# Patient Record
Sex: Female | Born: 1950 | Race: White | Hispanic: No | Marital: Single | Smoking: Current every day smoker
Health system: Southern US, Community
[De-identification: ages and names within clinical notes are randomized; demographics above are authoritative.]

## PROBLEM LIST (undated history)

## (undated) DIAGNOSIS — F1510 Other stimulant abuse, uncomplicated: Secondary | ICD-10-CM

## (undated) DIAGNOSIS — Q159 Congenital malformation of eye, unspecified: Secondary | ICD-10-CM

## (undated) DIAGNOSIS — J189 Pneumonia, unspecified organism: Secondary | ICD-10-CM

## (undated) DIAGNOSIS — F1010 Alcohol abuse, uncomplicated: Secondary | ICD-10-CM

## (undated) DIAGNOSIS — F32A Depression, unspecified: Secondary | ICD-10-CM

## (undated) DIAGNOSIS — Z720 Tobacco use: Secondary | ICD-10-CM

## (undated) DIAGNOSIS — F329 Major depressive disorder, single episode, unspecified: Secondary | ICD-10-CM

## (undated) HISTORY — PX: EYE SURGERY: SHX253

## (undated) HISTORY — PX: BREAST ENHANCEMENT SURGERY: SHX7

---

## 2004-01-13 ENCOUNTER — Ambulatory Visit (HOSPITAL_COMMUNITY): Admission: RE | Admit: 2004-01-13 | Discharge: 2004-01-13 | Payer: Self-pay | Admitting: *Deleted

## 2004-08-15 ENCOUNTER — Encounter: Admission: RE | Admit: 2004-08-15 | Discharge: 2004-08-15 | Payer: Self-pay | Admitting: General Practice

## 2004-09-01 ENCOUNTER — Encounter (INDEPENDENT_AMBULATORY_CARE_PROVIDER_SITE_OTHER): Payer: Self-pay | Admitting: *Deleted

## 2004-09-01 ENCOUNTER — Ambulatory Visit (HOSPITAL_COMMUNITY): Admission: RE | Admit: 2004-09-01 | Discharge: 2004-09-01 | Payer: Self-pay | Admitting: Pulmonary Disease

## 2004-09-13 ENCOUNTER — Ambulatory Visit: Payer: Self-pay | Admitting: Pulmonary Disease

## 2004-09-22 ENCOUNTER — Ambulatory Visit: Payer: Self-pay | Admitting: Pulmonary Disease

## 2004-10-16 ENCOUNTER — Ambulatory Visit: Payer: Self-pay | Admitting: Pulmonary Disease

## 2004-10-18 ENCOUNTER — Ambulatory Visit (HOSPITAL_COMMUNITY): Admission: RE | Admit: 2004-10-18 | Discharge: 2004-10-18 | Payer: Self-pay | Admitting: Pulmonary Disease

## 2004-12-01 ENCOUNTER — Ambulatory Visit (HOSPITAL_COMMUNITY): Admission: RE | Admit: 2004-12-01 | Discharge: 2004-12-01 | Payer: Self-pay | Admitting: Pulmonary Disease

## 2004-12-14 ENCOUNTER — Ambulatory Visit: Payer: Self-pay | Admitting: Pulmonary Disease

## 2005-07-30 ENCOUNTER — Ambulatory Visit: Payer: Self-pay | Admitting: Internal Medicine

## 2005-08-06 ENCOUNTER — Ambulatory Visit: Payer: Self-pay | Admitting: Internal Medicine

## 2006-11-18 ENCOUNTER — Ambulatory Visit: Payer: Self-pay | Admitting: Internal Medicine

## 2009-11-17 ENCOUNTER — Ambulatory Visit: Payer: Self-pay | Admitting: Vascular Surgery

## 2010-08-23 ENCOUNTER — Ambulatory Visit: Payer: Self-pay | Admitting: Diagnostic Radiology

## 2010-08-23 ENCOUNTER — Emergency Department (HOSPITAL_BASED_OUTPATIENT_CLINIC_OR_DEPARTMENT_OTHER): Admission: EM | Admit: 2010-08-23 | Discharge: 2010-08-23 | Payer: Self-pay | Admitting: Emergency Medicine

## 2010-11-05 HISTORY — PX: CATARACT EXTRACTION: SUR2

## 2010-11-05 HISTORY — PX: FOOT SURGERY: SHX648

## 2010-11-25 ENCOUNTER — Encounter: Payer: Self-pay | Admitting: Pulmonary Disease

## 2011-03-23 NOTE — Op Note (Signed)
NAME:  Connie Key, Connie Key                         ACCOUNT NO.:  [id number]   MEDICAL RECORD NO.:  [id number]                   PATIENT TYPE:  AMB   LOCATION:  ENDO                                 FACILITY:  [facility]   PHYSICIAN:  John C. Madilyn Fireman, M.D.                 DATE OF BIRTH:  Nov 28, 1950   DATE OF PROCEDURE:  01/13/2004  DATE OF DISCHARGE:                                 OPERATIVE REPORT   PROCEDURE PERFORMED:  Esophagogastroduodenoscopy.   ENDOSCOPIST:  Barrie Folk, M.D.   INDICATIONS FOR PROCEDURE:  Dysphagia and reflux.   DESCRIPTION OF PROCEDURE:  The patient was placed in the left lateral  decubitus position and placed on the pulse monitor with continuous low-flow  oxygen delivered by nasal cannula.  She was sedated with 50 mcg of IV  fentanyl and 5 mg of IV Versed.  The Olympus video endoscope was advanced  under direct vision into the oropharynx and esophagus was straight and of  normal caliber with the squamocolumnar line at 38 cm.  There was diffuse  erosive esophagitis in the distal 5 cm with areas of erythema and exudate  with two or three discrete erosions with possible minimal stricture  formation above a 2 cm hiatal hernia.  The stomach was entered and a small  amount of liquid secretions was suctioned from the fundus.  A retroflex view  of the cardia was unremarkable.  The fundus, body, antrum and pylorus all  appeared normal.  The duodenum was entered and both the bulb and second  portion were well inspected and appeared to be within normal limits.  The  scope was then withdrawn and the patient was returned to the recovery room  in stable condition.  The patient tolerated the procedure well.  There were  no immediate complications.   IMPRESSION:  Grade 4 esophagitis.   PLAN:  Treat with double dose proton pump inhibitor and discourage use of  Goody powders, which she takes frequently.  Follow-up in the office in six  weeks and will assess her dysphagia and  determine whether she needs  esophageal dilatation.                                               John C. Madilyn Fireman, M.D.    JCH/MEDQ  D:  01/13/2004  T:  01/14/2004  Job:  [id number]

## 2011-03-23 NOTE — Op Note (Signed)
NAMESHEQUILA, NEGLIA               ACCOUNT NO.:  [id number]   MEDICAL RECORD NO.:  [id number]          PATIENT TYPE:  AMB   LOCATION:  ENDO                         FACILITY:  [facility]   PHYSICIAN:  Danice Goltz, M.D. LHCDATE OF BIRTH:  [birth year]   DATE OF PROCEDURE:  09/01/2004  DATE OF DISCHARGE:                                 OPERATIVE REPORT   PROCEDURE:  Video bronchoscopy.   This is a 60 year old white female who is being evaluated for right hilar  mass.  She is currently a smoker.  The patient also has a left upper lobe  nodule.  The patient understood the limitations, benefits and potential  complications of the procedure and agreed to go ahead.  The patient was an  outpatient status.  She had a suitable exam for conscious sedation.  She is  an ASA level 2.   The patient presented to the endoscopy suite where she was placed on the  fluoroscopy table.  She had an IV in place.  She was being monitored as far  as respirations, heart rate, blood pressure, and oxygen saturations.  The  patient had the posterior pharynx anesthetized with Cetacaine.  She was  premedicated with a total of 5 mg Versed and 100 mcg of Fentanyl IV.  The  patient was poorly cooperative throughout the entire procedure and could not  be further sedated to avoid respiratory depression.   The patient had the bronchoscope advanced to the vocal cords.  These were  somewhat irritated.  The trachea was noted to be erythematous.  The carina  was sharp.  The bronchoscope was then advanced to the right where the right  upper lobe was noted.  No lesions were noted.  Her right middle lobe and  right lower lobe subsegments were opened.  She had, however, extensive  chronic bronchitic changes.  She also had some mucous plugs that had to be  suctioned from the right middle lobe.  The patient, again, was poorly  cooperative and the procedure had to be done somewhat hastily as she  continued to try to pull on the  bronchoscope and her IV.  The bronchoscope  was then pulled back to the left where the left upper lobe lingula and left  lower lobe subsegments were examined and these had no endobronchial lesions.  Again, numerous mucous plugs were removed.  The patient also had chronic  bronchitic changes noted.   IMPRESSION:  1.  No overt endobronchial lesion.  2.  Examination hampered due to poorly cooperative patient.  3.  Mucous retention.  4.  Chronic bronchitic changes.   PLAN:  Await washings which have been sent for cytology, await those  results, determine accordingly further tests to perform if necessary.  Follow up with [facility].  The patient was sent to the endoscopy  suite recovery room in good condition.  She tolerated the procedure well.     LG/MEDQ  D:  09/01/2004  T:  09/01/2004  Job:  [id number]

## 2011-11-06 DIAGNOSIS — Q159 Congenital malformation of eye, unspecified: Secondary | ICD-10-CM

## 2011-11-06 DIAGNOSIS — J189 Pneumonia, unspecified organism: Secondary | ICD-10-CM

## 2011-11-06 HISTORY — DX: Congenital malformation of eye, unspecified: Q15.9

## 2011-11-06 HISTORY — DX: Pneumonia, unspecified organism: J18.9

## 2012-01-10 ENCOUNTER — Ambulatory Visit
Admission: RE | Admit: 2012-01-10 | Discharge: 2012-01-10 | Disposition: A | Discharge status: Home / Self Care | Payer: 59 | Source: Ambulatory Visit | Attending: Cardiology | Admitting: Cardiology

## 2012-01-10 ENCOUNTER — Other Ambulatory Visit: Payer: Self-pay | Admitting: Cardiology

## 2012-01-10 DIAGNOSIS — R0602 Shortness of breath: Secondary | ICD-10-CM

## 2012-02-05 ENCOUNTER — Other Ambulatory Visit: Payer: Self-pay

## 2012-02-05 ENCOUNTER — Emergency Department (HOSPITAL_COMMUNITY): Payer: 59

## 2012-02-05 ENCOUNTER — Inpatient Hospital Stay (HOSPITAL_COMMUNITY)
Admission: EM | Admit: 2012-02-05 | Discharge: 2012-02-08 | DRG: 101 | Disposition: A | Discharge status: Home / Self Care | Payer: 59 | Attending: Internal Medicine | Admitting: Internal Medicine

## 2012-02-05 ENCOUNTER — Encounter (HOSPITAL_COMMUNITY): Payer: Self-pay | Admitting: Internal Medicine

## 2012-02-05 DIAGNOSIS — Z720 Tobacco use: Secondary | ICD-10-CM

## 2012-02-05 DIAGNOSIS — M810 Age-related osteoporosis without current pathological fracture: Secondary | ICD-10-CM

## 2012-02-05 DIAGNOSIS — E876 Hypokalemia: Secondary | ICD-10-CM | POA: Diagnosis present

## 2012-02-05 DIAGNOSIS — R9431 Abnormal electrocardiogram [ECG] [EKG]: Secondary | ICD-10-CM | POA: Diagnosis present

## 2012-02-05 DIAGNOSIS — R911 Solitary pulmonary nodule: Secondary | ICD-10-CM | POA: Diagnosis present

## 2012-02-05 DIAGNOSIS — R569 Unspecified convulsions: Secondary | ICD-10-CM

## 2012-02-05 DIAGNOSIS — E559 Vitamin D deficiency, unspecified: Secondary | ICD-10-CM | POA: Diagnosis present

## 2012-02-05 DIAGNOSIS — G40802 Other epilepsy, not intractable, without status epilepticus: Principal | ICD-10-CM | POA: Diagnosis present

## 2012-02-05 DIAGNOSIS — F1020 Alcohol dependence, uncomplicated: Secondary | ICD-10-CM | POA: Diagnosis present

## 2012-02-05 DIAGNOSIS — F1010 Alcohol abuse, uncomplicated: Secondary | ICD-10-CM

## 2012-02-05 DIAGNOSIS — F17200 Nicotine dependence, unspecified, uncomplicated: Secondary | ICD-10-CM | POA: Diagnosis present

## 2012-02-05 DIAGNOSIS — F1520 Other stimulant dependence, uncomplicated: Secondary | ICD-10-CM

## 2012-02-05 DIAGNOSIS — K5289 Other specified noninfective gastroenteritis and colitis: Secondary | ICD-10-CM | POA: Diagnosis present

## 2012-02-05 DIAGNOSIS — Z780 Asymptomatic menopausal state: Secondary | ICD-10-CM

## 2012-02-05 HISTORY — DX: Congenital malformation of eye, unspecified: Q15.9

## 2012-02-05 HISTORY — DX: Depression, unspecified: F32.A

## 2012-02-05 HISTORY — DX: Pneumonia, unspecified organism: J18.9

## 2012-02-05 HISTORY — DX: Tobacco use: Z72.0

## 2012-02-05 HISTORY — DX: Major depressive disorder, single episode, unspecified: F32.9

## 2012-02-05 HISTORY — DX: Alcohol abuse, uncomplicated: F10.10

## 2012-02-05 HISTORY — DX: Other stimulant abuse, uncomplicated: F15.10

## 2012-02-05 LAB — BASIC METABOLIC PANEL
CO2: 27 mEq/L (ref 19–32)
Calcium: 9.1 mg/dL (ref 8.4–10.5)
Creatinine, Ser: 0.49 mg/dL — ABNORMAL LOW (ref 0.50–1.10)
GFR calc Af Amer: >90 mL/min (ref >90)
Sodium: 140 mEq/L (ref 135–145)

## 2012-02-05 LAB — COMPREHENSIVE METABOLIC PANEL
ALT: 11 U/L (ref 0–35)
AST: 16 U/L (ref 0–37)
Albumin: 3.7 g/dL (ref 3.5–5.2)
Alkaline Phosphatase: 105 U/L (ref 39–117)
BUN: 18 mg/dL (ref 6–23)
CO2: 28 mEq/L (ref 19–32)
Calcium: 8.9 mg/dL (ref 8.4–10.5)
Chloride: 100 mEq/L (ref 96–112)
Creatinine, Ser: 0.53 mg/dL (ref 0.50–1.10)
GFR calc Af Amer: >90 mL/min (ref >90)
GFR calc non Af Amer: >90 mL/min (ref >90)
Glucose, Bld: 88 mg/dL (ref 70–99)
Potassium: 2.8 mEq/L — ABNORMAL LOW (ref 3.5–5.1)
Sodium: 143 mEq/L (ref 135–145)
Total Bilirubin: 0.2 mg/dL — ABNORMAL LOW (ref 0.3–1.2)
Total Protein: 6.3 g/dL (ref 6.0–8.3)

## 2012-02-05 LAB — DIFFERENTIAL
Basophils Absolute: 0 10*3/uL (ref 0.0–0.1)
Basophils Relative: 1 % (ref 0–1)
Eosinophils Absolute: 0 10*3/uL (ref 0.0–0.7)
Eosinophils Relative: 0 % (ref 0–5)
Lymphocytes Relative: 19 % (ref 12–46)
Lymphs Abs: 1 10*3/uL (ref 0.7–4.0)
Monocytes Absolute: 0.4 10*3/uL (ref 0.1–1.0)
Monocytes Relative: 8 % (ref 3–12)
Neutro Abs: 3.6 10*3/uL (ref 1.7–7.7)
Neutrophils Relative %: 72 % (ref 43–77)

## 2012-02-05 LAB — CBC
HCT: 41.1 % (ref 36.0–46.0)
Hemoglobin: 13.9 g/dL (ref 12.0–15.0)
MCH: 33.3 pg (ref 26.0–34.0)
MCHC: 33.8 g/dL (ref 30.0–36.0)
MCV: 98.6 fL (ref 78.0–100.0)
Platelets: 241 10*3/uL (ref 150–400)
RBC: 4.17 MIL/uL (ref 3.87–5.11)
RDW: 13.5 % (ref 11.5–15.5)
WBC: 5 10*3/uL (ref 4.0–10.5)

## 2012-02-05 LAB — MAGNESIUM: Magnesium: 1.8 mg/dL (ref 1.5–2.5)

## 2012-02-05 MED ORDER — ONDANSETRON HCL 4 MG/2ML IJ SOLN
4.0000 mg | Freq: Four times a day (QID) | INTRAMUSCULAR | Status: DC | PRN
  Administered 2012-02-07: 4 mg via INTRAVENOUS
  Filled 2012-02-05: qty 2

## 2012-02-05 MED ORDER — TERIPARATIDE (RECOMBINANT) 600 MCG/2.4ML ~~LOC~~ SOLN
2.4000 mL | Freq: Every day | SUBCUTANEOUS | Status: DC
  Filled 2012-02-05: qty 2.4

## 2012-02-05 MED ORDER — ONDANSETRON HCL 4 MG PO TABS: 4.0000 mg | ORAL_TABLET | Freq: Four times a day (QID) | ORAL | Status: DC | PRN

## 2012-02-05 MED ORDER — TERIPARATIDE (RECOMBINANT) 600 MCG/2.4ML ~~LOC~~ SOLN: 600.0000 ug | Freq: Every day | SUBCUTANEOUS | Status: DC

## 2012-02-05 MED ORDER — ONDANSETRON HCL 4 MG/2ML IJ SOLN
4.0000 mg | Freq: Once | INTRAMUSCULAR | Status: AC
  Administered 2012-02-05: 4 mg via INTRAVENOUS
  Filled 2012-02-05: qty 2

## 2012-02-05 MED ORDER — THIAMINE HCL 100 MG/ML IJ SOLN
100.0000 mg | Freq: Every day | INTRAMUSCULAR | Status: DC
  Filled 2012-02-05 (×4): qty 1

## 2012-02-05 MED ORDER — NICOTINE 21 MG/24HR TD PT24
21.0000 mg | MEDICATED_PATCH | Freq: Every day | TRANSDERMAL | Status: DC
  Administered 2012-02-05 – 2012-02-08 (×4): 21 mg via TRANSDERMAL
  Filled 2012-02-05 (×4): qty 1

## 2012-02-05 MED ORDER — FOLIC ACID 1 MG PO TABS
1.0000 mg | ORAL_TABLET | Freq: Every day | ORAL | Status: DC
  Administered 2012-02-05 – 2012-02-08 (×4): 1 mg via ORAL
  Filled 2012-02-05 (×4): qty 1

## 2012-02-05 MED ORDER — VITAMIN B-1 100 MG PO TABS
100.0000 mg | ORAL_TABLET | Freq: Every day | ORAL | Status: DC
  Administered 2012-02-05 – 2012-02-08 (×4): 100 mg via ORAL
  Filled 2012-02-05 (×4): qty 1

## 2012-02-05 MED ORDER — SODIUM CHLORIDE 0.9 % IV SOLN
Freq: Once | INTRAVENOUS | Status: AC
  Administered 2012-02-05: 14:00:00 via INTRAVENOUS

## 2012-02-05 MED ORDER — ADULT MULTIVITAMIN W/MINERALS CH
1.0000 | ORAL_TABLET | Freq: Every day | ORAL | Status: DC
  Administered 2012-02-05 – 2012-02-08 (×4): 1 via ORAL
  Filled 2012-02-05 (×4): qty 1

## 2012-02-05 MED ORDER — LORAZEPAM 2 MG/ML IJ SOLN
1.0000 mg | Freq: Four times a day (QID) | INTRAMUSCULAR | Status: DC | PRN
  Administered 2012-02-06 – 2012-02-07 (×2): 1 mg via INTRAVENOUS
  Filled 2012-02-05 (×2): qty 1

## 2012-02-05 MED ORDER — NAPROXEN 250 MG PO TABS
250.0000 mg | ORAL_TABLET | Freq: Two times a day (BID) | ORAL | Status: DC
  Administered 2012-02-06 – 2012-02-08 (×6): 250 mg via ORAL
  Filled 2012-02-05 (×9): qty 1

## 2012-02-05 MED ORDER — MAGNESIUM SULFATE 40 MG/ML IJ SOLN
2.0000 g | Freq: Once | INTRAMUSCULAR | Status: AC
  Administered 2012-02-05: 2 g via INTRAVENOUS
  Filled 2012-02-05: qty 50

## 2012-02-05 MED ORDER — ENOXAPARIN SODIUM 40 MG/0.4ML ~~LOC~~ SOLN
40.0000 mg | Freq: Every day | SUBCUTANEOUS | Status: DC
  Administered 2012-02-05 – 2012-02-07 (×3): 40 mg via SUBCUTANEOUS
  Filled 2012-02-05 (×4): qty 0.4

## 2012-02-05 MED ORDER — POTASSIUM CHLORIDE CRYS ER 20 MEQ PO TBCR
40.0000 meq | EXTENDED_RELEASE_TABLET | Freq: Once | ORAL | Status: AC
  Administered 2012-02-05: 40 meq via ORAL
  Filled 2012-02-05: qty 2

## 2012-02-05 MED ORDER — SODIUM CHLORIDE 0.9 % IV SOLN
INTRAVENOUS | Status: DC
  Administered 2012-02-05: 19:00:00 via INTRAVENOUS
  Administered 2012-02-06 – 2012-02-07 (×2): 125 mL/h via INTRAVENOUS
  Administered 2012-02-07: 01:00:00 via INTRAVENOUS

## 2012-02-05 MED ORDER — POTASSIUM CHLORIDE CRYS ER 20 MEQ PO TBCR
40.0000 meq | EXTENDED_RELEASE_TABLET | Freq: Once | ORAL | Status: AC
  Administered 2012-02-06: 40 meq via ORAL
  Filled 2012-02-05: qty 2

## 2012-02-05 MED ORDER — LORAZEPAM 1 MG PO TABS
1.0000 mg | ORAL_TABLET | Freq: Four times a day (QID) | ORAL | Status: DC | PRN
  Administered 2012-02-06: 1 mg via ORAL
  Filled 2012-02-05: qty 1

## 2012-02-05 MED ORDER — ESCITALOPRAM OXALATE 20 MG PO TABS
20.0000 mg | ORAL_TABLET | Freq: Every day | ORAL | Status: DC
  Administered 2012-02-06 – 2012-02-08 (×3): 20 mg via ORAL
  Filled 2012-02-05 (×3): qty 1

## 2012-02-05 NOTE — ED Notes (Signed)
Pt also takes fortao for for bone weakness.  Not found in medication list.  Pt concerned that she has been having bp issues and now this since taking this new drug.  Injection daily

## 2012-02-05 NOTE — ED Notes (Signed)
Called report to [phone]

## 2012-02-05 NOTE — ED Notes (Signed)
Per EMS pt has appt to see cardiologist next week for bloodshot eyes that she has been having for some time.  No other history is known.

## 2012-02-05 NOTE — ED Notes (Addendum)
Per patient she has not had hypertension before.  Her BP normally on low side.  100/60 is her norm.  Denies fevers and headaches.  Pt states she has a slight headache now but not terrible.  Pt answering all questions appropriatly and even joking about ID bracelet.  Pt now alert oriented X4
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
## 2012-02-05 NOTE — Progress Notes (Signed)
Patient complaining of headache at 7/10 scale. BP noted running in the 150's over 80's. Bedtime BP reads 170/79. No prn med noted. MD notified.

## 2012-02-05 NOTE — ED Notes (Signed)
[id number] Ready 

## 2012-02-06 ENCOUNTER — Inpatient Hospital Stay (HOSPITAL_COMMUNITY): Payer: 59

## 2012-02-06 DIAGNOSIS — R569 Unspecified convulsions: Secondary | ICD-10-CM

## 2012-02-06 LAB — RAPID URINE DRUG SCREEN, HOSP PERFORMED
Cocaine: NOT DETECTED
Opiates: NOT DETECTED

## 2012-02-06 LAB — BASIC METABOLIC PANEL
CO2: 27 mEq/L (ref 19–32)
Calcium: 8.9 mg/dL (ref 8.4–10.5)
Creatinine, Ser: 0.5 mg/dL (ref 0.50–1.10)
GFR calc non Af Amer: >90 mL/min (ref >90)
Sodium: 141 mEq/L (ref 135–145)

## 2012-02-06 LAB — CBC
MCH: 32.9 pg (ref 26.0–34.0)
MCV: 100.9 fL — ABNORMAL HIGH (ref 78.0–100.0)
Platelets: 251 10*3/uL (ref 150–400)
RBC: 4.22 MIL/uL (ref 3.87–5.11)
RDW: 13.2 % (ref 11.5–15.5)
WBC: 4.8 10*3/uL (ref 4.0–10.5)

## 2012-02-06 MED ORDER — TERIPARATIDE (RECOMBINANT) 600 MCG/2.4ML ~~LOC~~ SOLN
20.0000 ug | Freq: Every day | SUBCUTANEOUS | Status: DC
  Administered 2012-02-07: 20 ug via SUBCUTANEOUS

## 2012-02-06 MED ORDER — TERIPARATIDE (RECOMBINANT) 600 MCG/2.4ML ~~LOC~~ SOLN
20.0000 ug | Freq: Every day | SUBCUTANEOUS | Status: DC
  Administered 2012-02-06: 20 ug via SUBCUTANEOUS

## 2012-02-06 MED ORDER — POTASSIUM CHLORIDE CRYS ER 20 MEQ PO TBCR
40.0000 meq | EXTENDED_RELEASE_TABLET | Freq: Once | ORAL | Status: AC
  Administered 2012-02-06: 40 meq via ORAL
  Filled 2012-02-06: qty 2

## 2012-02-06 MED ORDER — DEXTROSE 50 % IV SOLN: 1.0000 | Freq: Once | INTRAVENOUS | Status: DC

## 2012-02-06 MED ORDER — GADOBENATE DIMEGLUMINE 529 MG/ML IV SOLN
11.0000 mL | Freq: Once | INTRAVENOUS | Status: AC
  Administered 2012-02-06: 11 mL via INTRAVENOUS

## 2012-02-06 NOTE — Progress Notes (Signed)
UR Completed.  Serria Sloma Jane [phone] 02/06/2012  

## 2012-02-06 NOTE — Progress Notes (Signed)
Medical Student Daily Progress Note  Subjective: Did well overnight, no acute events.    Objective: Vital signs in last 24 hours: Filed Vitals:   02/05/12 1814 02/05/12 2146 02/06/12 0157 02/06/12 0519  BP:  170/79 140/79 155/94  Pulse:  90  91  Temp:  97.6 F (36.4 C)  97.9 F (36.6 C)  TempSrc:    Oral  Resp:  20  18  Height: 5\' 6"  (1.676 m)     Weight: 58.06 kg (128 lb)     SpO2:  91%  92%    Intake/Output Summary (Last 24 hours) at 02/06/12 1034 Last data filed at 02/06/12 0911  Gross per 24 hour  Intake    120 ml  Output      0 ml  Net    120 ml   Physical Exam: General - alert and pleasant, no distress HEENT - subconjunctival hemorrhages (R > L) slightly better than yesterday CV - RRR, no murmurs, rubs, gallops Lung - CTAB, no wheezes or crackles Neuro - CNs and motor grossly intact  Lab Results: Basic Metabolic Panel:  Lab 02/06/12 8295 02/05/12 2031 02/05/12 1756  NA 141 140 --  K 3.4* 3.4* --  CL 101 99 --  CO2 27 27 --  GLUCOSE 63* 78 --  BUN 16 18 --  CREATININE 0.50 0.49* --  CALCIUM 8.9 9.1 --  MG -- -- 1.8  PHOS -- -- --   Liver Function Tests:  Lab 02/05/12 1355  AST 16  ALT 11  ALKPHOS 105  BILITOT 0.2*  PROT 6.3  ALBUMIN 3.7   No results found for this basename: LIPASE:2,AMYLASE:2 in the last 168 hours No results found for this basename: AMMONIA:2 in the last 168 hours CBC:  Lab 02/06/12 0610 02/05/12 1355  WBC 4.8 5.0  NEUTROABS -- 3.6  HGB 13.9 13.9  HCT 42.6 41.1  MCV 100.9* 98.6  PLT 251 241   Cardiac Enzymes: No results found for this basename: CKTOTAL:3,CKMB:3,CKMBINDEX:3,TROPONINI:3 in the last 168 hours BNP: No results found for this basename: PROBNP:3 in the last 168 hours D-Dimer: No results found for this basename: DDIMER:2 in the last 168 hours CBG:  Lab 02/06/12 1001  GLUCAP 88   Hemoglobin A1C: No results found for this basename: HGBA1C in the last 168 hours Fasting Lipid Panel: No results found for  this basename: CHOL,HDL,LDLCALC,TRIG,CHOLHDL,LDLDIRECT in the last 621 hours Thyroid Function Tests: No results found for this basename: TSH,T4TOTAL,FREET4,T3FREE,THYROIDAB in the last 168 hours Coagulation: No results found for this basename: LABPROT:4,INR:4 in the last 168 hours Anemia Panel: No results found for this basename: VITAMINB12,FOLATE,FERRITIN,TIBC,IRON,RETICCTPCT in the last 168 hours Urine Drug Screen: Drugs of Abuse     Component Value Date/Time   LABOPIA NONE DETECTED 02/06/2012 0930   COCAINSCRNUR NONE DETECTED 02/06/2012 0930   LABBENZ NONE DETECTED 02/06/2012 0930   AMPHETMU NONE DETECTED 02/06/2012 0930   THCU NONE DETECTED 02/06/2012 0930   LABBARB NONE DETECTED 02/06/2012 0930    Alcohol Level:  Lab 02/06/12 0913  ETH <11   Urinalysis: No results found for this basename: COLORURINE:2,APPERANCEUR:2,LABSPEC:2,PHURINE:2,GLUCOSEU:2,HGBUR:2,BILIRUBINUR:2,KETONESUR:2,PROTEINUR:2,UROBILINOGEN:2,NITRITE:2,LEUKOCYTESUR:2 in the last 168 hours  Studies/Results: Ct Head Wo Contrast  02/05/2012  *RADIOLOGY REPORT*  Clinical Data: Pain.  Witnessed seizure with postictal.  No prior history of seizures.  Sudden onset of dizziness.  CT HEAD WITHOUT CONTRAST  Technique:  Contiguous axial images were obtained from the base of the skull through the vertex without contrast.  Comparison: CT head without contrast 08/23/2010.  Findings: No acute cortical infarct, hemorrhage, mass lesion is present.  The ventricles are of normal size.  No significant extra- axial fluid collection is present.  Vascular calcifications are noted within the cavernous carotid arteries bilaterally.  The paranasal sinuses and mastoid air cells are clear.  The osseous skull is intact.  IMPRESSION:  1.  Normal CT appearance the brain. 2.  Atherosclerosis.  Original Report Authenticated By: Jamesetta Orleans. MATTERN, M.D.   Medications:  Scheduled Meds:   . sodium chloride   Intravenous Once  . dextrose  1 ampule Intravenous  Once  . enoxaparin  40 mg Subcutaneous q1800  . escitalopram  20 mg Oral Daily  . folic acid  1 mg Oral Daily  . magnesium sulfate 1 - 4 g bolus IVPB  2 g Intravenous Once  . mulitivitamin with minerals  1 tablet Oral Daily  . naproxen  250 mg Oral BID WC  . nicotine  21 mg Transdermal Daily  . ondansetron  4 mg Intravenous Once  . potassium chloride  40 mEq Oral Once  . potassium chloride  40 mEq Oral Once  . potassium chloride  40 mEq Oral Once  . Teriparatide (Recombinant)  20 mcg Subcutaneous Daily  . thiamine  100 mg Oral Daily   Or  . thiamine  100 mg Intravenous Daily  . DISCONTD: Teriparatide (Recombinant)  2.4 mL Subcutaneous Daily  . DISCONTD: Teriparatide (Recombinant)  600 mcg Subcutaneous Daily   Continuous Infusions:   . sodium chloride 125 mL/hr (02/06/12 0343)   PRN Meds:.LORazepam, LORazepam, ondansetron (ZOFRAN) IV, ondansetron Assessment/Plan: 1) Loss of consciousness: The pattern of loss of consciousness, mouth foaming, subsequent tiredness/confusion, possible evidence of tongue-biting, and reported seizure-like activity by EMS seems consistent with new seizure disorder. The relationship to the flashes experienced suggests the possibility of complex partial seizure versus migrainous seizure vs occipital TIA/stroke. Hypoglycemia (was 63 the AM of 02/06/12) is a common cause of seizures and is therefore on the differential diagnosis.  Prolonged QTc raises the possibility of Torsades de Pointes as a cause of her loss of consciousness.  Neurology recommends MRI followed by EEG, and requests withholding neuroleptics and that they be contacted if she has another episode.  Urine toxicology on admission was negative, blood etoh on day 2 was <11.  Spoke with Earley Brooke who confirmed "flashers" was not due to retinal detachment, and macular degeneration was found - Dilated eye exam done March 7th, 2013. - Follow up brain MRI  - Follow up EEG  - Will follow up EMS records -  Waiting to hear back from Neurology why - Follow up on 12-lead EKG (obtain when potassium repleted) - Chest xray to follow up recent pneumonia - Monitor regular capillary blood glucoses  2) Prolonged QTc: QTc 526 ms on admission.  Could be due to hypokalemia (was 2.8) or hypomagnesemia (1.8). - Continue telemetry to monitor (patient at risk for Torsades de Pointes activity) - Provide oral KCl - Once potassium repleted, obtain 12-lead EKG  3) Alcoholism: Admits to being alcohol-dependent.  Denies history of withdrawal symptoms.  Thiamine given.  Normal LFTs, normal Mg (2 g given IV also).  Refused counseling. - CIWA protocol  - Continue to promote acceptance of counseling - Obtain PT/INR  4) Smoking: 40 pack-year smoking history.  - Nicotine patch to prevent withdrawal symptoms.   5) Osteoporosis: Present prior to admission. Diagnosed by endocrinologist, Dr. Corwin Levins.  DEXA on 08/23/11: femoral neck T score -2.7, lumbar T score -2.0,  forearm T score -4.2. She has vitamin D deficiency (level 43.7 on 01/24/12) and has suffered multiple fractures, including pelvic and foot. Workup with PTH was normal (level 71.8 in November 2012). Recently started a new medication a few months ago, recombinant teriparatide Fortera . She could not take boniva and fosamax due to GERD. She has a diagnosis of collagenous colits, for which she does not currently take steroids. She denies ever having taken steroids, but did not seem certain.    LOS: 1 day   This is a Psychologist, occupational Note.  The care of the patient was discussed with Dr. Meredith Pel and the assessment and plan formulated with their assistance.  Please see their attached note for official documentation of the daily encounter.  Aleene Davidson 02/06/2012, 10:34 AM
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
## 2012-02-06 NOTE — Progress Notes (Signed)
Patient noted non productive coughing. Sounds wet when coughing. Lungs auscultated and wet crackles noted to bilateral base. Stated she had pneumonia in February and took some ABT for it but does not know if it cleared. MD made aware and will notify oncoming staff.

## 2012-02-06 NOTE — Progress Notes (Signed)
Resident Co-sign Daily Note: I have seen the patient and reviewed the daily progress note by MS -IV, Prince Rome and discussed the care of the patient with him.  See below for documentation of my findings, assessment, and plans.  Subjective: She states that she feels better, has not had any seizure.  She states that she was seeing colored swirls intermittently in the past month. She does endorses occasional headache which light does make it worse, but denies any personal history of migraine or family history of migraine.  She also reports that she was diagnosed with pneumonia in early March and was treated with 5 days course of abx and her visual problem actually resolved for about 1 week and then returned. Objective: Vital signs in last 24 hours: Filed Vitals:   02/05/12 1814 02/05/12 2146 02/06/12 0157 02/06/12 0519  BP:  170/79 140/79 155/94  Pulse:  90  91  Temp:  97.6 F (36.4 C)  97.9 F (36.6 C)  TempSrc:    Oral  Resp:  20  18  Height: 5\' 6"  (1.676 m)     Weight: 128 lb (58.06 kg)     SpO2:  91%  92%   Physical Exam: General: alert, well-developed, and cooperative to examination.  Eyes: conjunctival hemorrhages on Right side. Fundoscopic exam: limited, did not appreciate retinal hemorrhage. Lungs: normal respiratory effort, no accessory muscle use, normal breath sounds, no crackles, and no wheezes. Heart: normal rate, regular rhythm, no murmur, no gallop, and no rub.  Abdomen: soft, non-tender, normal bowel sounds, no distention, no guarding Msk: no joint swelling, no joint warmth, and no redness over joints.  Extremities: No cyanosis, clubbing, edema Neurologic: alert & oriented X3, cranial nerves II-XII intact, strength normal in all extremities, sensation intact to light touch Skin: turgor normal and no rashes.  Psych: Oriented X3, memory intact for recent and remote, normally interactive, good eye contact, not anxious appearing, and not depressed appearing.  Lab  Results: Reviewed and documented in Electronic Record Micro Results: Reviewed and documented in Electronic Record Studies/Results: Reviewed and documented in Electronic Record Medications: Scheduled Meds:   . sodium chloride   Intravenous Once  . dextrose  1 ampule Intravenous Once  . enoxaparin  40 mg Subcutaneous q1800  . escitalopram  20 mg Oral Daily  . folic acid  1 mg Oral Daily  . gadobenate dimeglumine  11 mL Intravenous Once  . magnesium sulfate 1 - 4 g bolus IVPB  2 g Intravenous Once  . mulitivitamin with minerals  1 tablet Oral Daily  . naproxen  250 mg Oral BID WC  . nicotine  21 mg Transdermal Daily  . ondansetron  4 mg Intravenous Once  . potassium chloride  40 mEq Oral Once  . potassium chloride  40 mEq Oral Once  . potassium chloride  40 mEq Oral Once  . Teriparatide (Recombinant)  20 mcg Subcutaneous Daily  . thiamine  100 mg Oral Daily   Or  . thiamine  100 mg Intravenous Daily  . DISCONTD: Teriparatide (Recombinant)  2.4 mL Subcutaneous Daily  . DISCONTD: Teriparatide (Recombinant)  600 mcg Subcutaneous Daily   Continuous Infusions:   . sodium chloride 125 mL/hr (02/06/12 0343)   PRN Meds:.LORazepam, LORazepam, ondansetron (ZOFRAN) IV, ondansetron  Assessment/Plan: 1) Seizure: stable, no other episodes since admission. Unclear etiology at this time. -  brain MRI pending -  EEG pending - Will follow up EMS records    2) Prolonged QTc: QTc 526 ms on  admission. Could be due to hypokalemia (was 2.8) or hypomagnesemia (1.8) on admission but K is now 3.4.  - Continue telemetry to monitor (patient at risk for Torsades de Pointes activity)  -  KCl 40 mEq x1 - Once potassium repleted, obtain 12-lead EKG in AM  3) Alcoholism: Admits to being alcohol-dependent. Denies history of withdrawal symptoms. Thiamine given. Normal LFTs, normal Mg (2 g given IV also). Refused counseling.  - CIWA protocol  - Get PT/INR   4) Smoking: 40 pack-year smoking history.  -  Nicotine patch to prevent withdrawal symptoms.  -Tobacco cessation counseling  5) Osteoporosis: Diagnosed by endocrinologist, Dr. Corwin Levins.  DEXA on 08/23/11: femoral neck T score -2.7, lumbar T score -2.0, forearm T score -4.2. She has vitamin D deficiency (level 43.7 on 01/24/12) and has suffered multiple fractures, including pelvic and foot. Workup with [facility] was normal (level 71.8 in November 2012).  Will continue teriparatide Fortera   6) Hx of right nodular opacity: on CXR 01/10/12.  Patient still has a cough.  Will recheck chest Xray to ensure complete resolution.    LOS: 1 day   Jamia Hoban 02/06/2012, 1:32 PM
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
## 2012-02-06 NOTE — Progress Notes (Signed)
[facility] PROGRESS NOTE    SUBJECTIVE   No seizures over night.  Alert and oriented.  No further complaints.   OBJECTIVE   Vital signs in last 24 hours: Temp:  [97.6 F (36.4 C)-98.5 F (36.9 C)] 97.9 F (36.6 C) (04/03 0519) Pulse Rate:  [82-101] 91  (04/03 0519) Resp:  [18-22] 18  (04/03 0519) BP: (140-170)/(75-94) 155/94 mmHg (04/03 0519) SpO2:  [91 %-99 %] 92 % (04/03 0519) Weight:  [58.06 kg (128 lb)] 58.06 kg (128 lb) (04/02 1814)  Intake/Output from previous day:   Intake/Output this shift:   Nutritional status: General  Past Medical History  Diagnosis Date  . Osteoporosis     over 5 fractures including pelvic fractures, started on  Fortero, followed by Endocrinology  . Eye abnormalities 2013    flashing lights  . 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 Alcohol abuse     drinks 1/2 gallon of tequila per week  . Tobacco abuse   . Pneumonia 2013    CAP  . Depression     Neurologic Exam:  Mental Status: Alert, oriented, thought content appropriate.  Speech fluent without evidence of aphasia. Able to follow 3 step commands without difficulty. Cranial Nerves: II-Visual fields grossly intact. III/IV/VI-Extraocular movements intact.  Pupils reactive bilaterally. V/VII-Smile symmetric VIII-grossly intact IX/X-normal gag XI-bilateral shoulder shrug XII-midline tongue extension Motor: 5/5 bilaterally with normal tone and bulk Sensory: Pinprick and light touch intact throughout, bilaterally Deep Tendon Reflexes: 2+ and symmetric throughout Plantars: Downgoing bilaterally Cerebellar: Normal finger-to-nose, normal rapid alternating movements and normal heel-to-shin test.  Normal gait and station.   Lab Results: No results found for this basename: cbc, bmp, coags, chol, tri, ldl, hga1c   Lipid Panel No results found for this basename: CHOL,TRIG,HDL,CHOLHDL,VLDL,LDLCALC in the last 72  hours  Studies/Results: Ct Head Wo Contrast  02/05/2012  *RADIOLOGY REPORT*  Clinical Data: Pain.  Witnessed seizure with postictal.  No prior history of seizures.  Sudden onset of dizziness.  CT HEAD WITHOUT CONTRAST  Technique:  Contiguous axial images were obtained from the base of the skull through the vertex without contrast.  Comparison: CT head without contrast 08/23/2010.  Findings: No acute cortical infarct, hemorrhage, mass lesion is present.  The ventricles are of normal size.  No significant extra- axial fluid collection is present.  Vascular calcifications are noted within the cavernous carotid arteries bilaterally.  The paranasal sinuses and mastoid air cells are clear.  The osseous skull is intact.  IMPRESSION:  1.  Normal CT appearance the brain. 2.  Atherosclerosis.  Original Report Authenticated By: Jamesetta Orleans. MATTERN, M.D.    Medications:     Scheduled:   . sodium chloride   Intravenous Once  . enoxaparin  40 mg Subcutaneous q1800  . escitalopram  20 mg Oral Daily  . folic acid  1 mg Oral Daily  . magnesium sulfate 1 - 4 g bolus IVPB  2 g Intravenous Once  . mulitivitamin with minerals  1 tablet Oral Daily  . naproxen  250 mg Oral BID WC  . nicotine  21 mg Transdermal Daily  . ondansetron  4 mg Intravenous Once  . potassium chloride  40 mEq Oral Once  . potassium chloride  40 mEq  Oral Once  . Teriparatide (Recombinant)  600 mcg Subcutaneous Daily  . thiamine  100 mg Oral Daily   Or  . thiamine  100 mg Intravenous Daily  . DISCONTD: Teriparatide (Recombinant)  2.4 mL Subcutaneous Daily    Assessment/Plan:    Patient Active Hospital Problem List: Seizure (02/05/2012)   Assessment: No seizure events over night. Patient alert and oriented.     Plan: Still awaiting workup to further evaluate cause of events: 1) MRI PENDING 2) EEG PENDING 3) I have discussed in full No driving heavy machinery or automobiles, no climbing heights or staying in standing water for  6-12 months or until cleared by neurologist/primary care MD     Felicie Morn PA-C [facility] [phone]  02/06/2012, 8:44 AM

## 2012-02-06 NOTE — Progress Notes (Signed)
Routine EEG w/ video completed. 

## 2012-02-07 ENCOUNTER — Inpatient Hospital Stay (HOSPITAL_COMMUNITY): Payer: 59

## 2012-02-07 ENCOUNTER — Other Ambulatory Visit: Payer: Self-pay

## 2012-02-07 LAB — BASIC METABOLIC PANEL
CO2: 26 mEq/L (ref 19–32)
Chloride: 100 mEq/L (ref 96–112)
Sodium: 137 mEq/L (ref 135–145)

## 2012-02-07 LAB — MAGNESIUM: Magnesium: 1.7 mg/dL (ref 1.5–2.5)

## 2012-02-07 LAB — CBC
MCV: 97.5 fL (ref 78.0–100.0)
Platelets: 183 10*3/uL (ref 150–400)
RBC: 4.47 MIL/uL (ref 3.87–5.11)
WBC: 4.9 10*3/uL (ref 4.0–10.5)

## 2012-02-07 MED ORDER — LEVETIRACETAM 500 MG PO TABS
500.0000 mg | ORAL_TABLET | Freq: Two times a day (BID) | ORAL | Status: DC
  Administered 2012-02-07 – 2012-02-08 (×3): 500 mg via ORAL
  Filled 2012-02-07 (×4): qty 1

## 2012-02-07 MED ORDER — CLONIDINE HCL 0.1 MG/24HR TD PTWK
0.1000 mg | MEDICATED_PATCH | TRANSDERMAL | Status: DC
  Administered 2012-02-07: 0.1 mg via TRANSDERMAL
  Filled 2012-02-07: qty 1

## 2012-02-07 MED ORDER — IOHEXOL 300 MG/ML  SOLN
80.0000 mL | Freq: Once | INTRAMUSCULAR | Status: AC | PRN
  Administered 2012-02-07: 80 mL via INTRAVENOUS

## 2012-02-07 NOTE — Progress Notes (Signed)
Resident Co-sign Daily Note: I have seen the patient and reviewed the daily progress note by MS4, Prince Rome and discussed the care of the patient with him.  See below for documentation of my findings, assessment, and plans.  Subjective: She reports having a headache earlier this morning and late last night so received ativan at 9PM and 5AM.  Patient also became tachycardic when she gets up and moves around.  But at the time we rounded on patient, she reports feeling "fine" and was asking to go home.  Objective: Vital signs in last 24 hours: Filed Vitals:   02/07/12 0517 02/07/12 0619 02/07/12 0840 02/07/12 1012  BP: 196/126 155/92 141/91 155/101  Pulse: 87  112 102  Temp: 98.7 F (37.1 C)  98 F (36.7 C) 98.4 F (36.9 C)  TempSrc: Oral  Oral Oral  Resp: 24  20 20   Height:      Weight:      SpO2: 94%  95% 92%   Physical Exam: General: alert, well-developed, and cooperative to examination.  Eyes: conjunctival hemorrhages on Right side almost entirely resolved.  Lungs: normal respiratory effort, no accessory muscle use, normal breath sounds, no crackles, and no wheezes. Heart: normal rate, regular rhythm, no murmur, no gallop, and no rub.  Abdomen: soft, non-tender, normal bowel sounds, no distention, no guarding  Extremities: No cyanosis, clubbing, edema Neurologic: alert & oriented X3, cranial nerves II-XII intact, strength normal in all extremities, sensation intact to light touch  Psych: Oriented X3, memory intact for recent and remote, normally interactive, good eye contact, slightly anxious appearing,   Lab Results: Reviewed and documented in Electronic Record Micro Results: Reviewed and documented in Electronic Record Studies/Results: Reviewed and documented in Electronic Record Medications:  Scheduled Meds:   . cloNIDine  0.1 mg Transdermal Weekly  . dextrose  1 ampule Intravenous Once  . enoxaparin  40 mg Subcutaneous q1800  . escitalopram  20 mg Oral Daily  .  folic acid  1 mg Oral Daily  . gadobenate dimeglumine  11 mL Intravenous Once  . levETIRAcetam  500 mg Oral BID  . mulitivitamin with minerals  1 tablet Oral Daily  . naproxen  250 mg Oral BID WC  . nicotine  21 mg Transdermal Daily  . Teriparatide (Recombinant)  20 mcg Subcutaneous Daily  . thiamine  100 mg Oral Daily   Or  . thiamine  100 mg Intravenous Daily  . DISCONTD: Teriparatide (Recombinant)  20 mcg Subcutaneous Daily   Continuous Infusions:   . sodium chloride 125 mL/hr (02/07/12 1035)   PRN Meds:.LORazepam, LORazepam, ondansetron (ZOFRAN) IV, ondansetron Assessment/Plan:  1) Seizure: stable, no other episodes since admission. Unclear etiology (head CT, brain MRI, EEG unrevealing). Per Neurology (Dr. 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  -will start Keppra 500mg  po bid -Patient will follow up with outpatient neurology  2) Prolonged QTc: Improved QTc 526 ms on admission to 503 on repeat 12-lead EKG (02/07/12). Likely due to hypokalemia (was 2.8) or hypomagnesemia (1.8) on admission but K is now 3.6 after providing 40 mEq KCl.  - Continue telemetry to monitor (patient at risk for Torsades de Pointes activity)   3) Alcoholism: Admits to being alcohol-dependent. Nurse reports episode of withdrawal symptoms last night (sweating, tachycardia, vomiting, headache, elevated BP) and was treated with lorazepam per CIWA protocol. Denies prior history of withdrawal symptoms. Thiamine given. Normal LFTs, normal Mg (2 g given IV also). Refused  counseling.  - CIWA protocol  - Low dose clonidine patch for withdrawal symptoms (including persistently elevated high BP since admission), she may or may not need longterm once her withdrawal symptoms resolve and if her blood pressure normalizes.    4) Smoking: 40 pack-year smoking history.  - Nicotine patch to prevent withdrawal symptoms.  -Tobacco cessation  counseling   5) Osteoporosis: stable, will continue home dose teriparatide.   6) Hx of right nodular opacity: After reviewing her imaging, patient had CT of chest in 2005 which showed a right sided nodular density and had follow up PET scan which was thought to be post-inflammatory.  She has not had any recent CT follow since 2005.  Chest Xray on 3/7 and 4/3 again demonstrated the right sided nodule.  Given her history of smoking, malignancy is in the differential.  -Will get Chest CT to better evaluate nodule to assess for change since prior CT   Dispo: likely home in AM once her withdrawal symptoms resolve.   LOS: 2 days   Maudy Yonan 02/07/2012, 11:42 AM

## 2012-02-07 NOTE — Progress Notes (Signed)
Pt started on Kepra and Clonidine today; written and verbal education provided; pt verbalized understanding of medications.

## 2012-02-07 NOTE — Progress Notes (Signed)
Medical Student Daily Progress Note  Subjective: Had episode overnight suggestive of alcohol withdrawal symptoms (sweating, tachycardia, vomiting, headache, elevated BP) and was treated with lorazepam per CIWA protocol.  Reports  Mild headache this morning, but otherwise feels fine.  Objective: Vital signs in last 24 hours: Filed Vitals:   02/07/12 0517 02/07/12 0619 02/07/12 0840 02/07/12 1012  BP: 196/126 155/92 141/91 155/101  Pulse: 87  112 102  Temp: 98.7 F (37.1 C)  98 F (36.7 C) 98.4 F (36.9 C)  TempSrc: Oral  Oral Oral  Resp: 24  20 20   Height:      Weight:      SpO2: 94%  95% 92%    Intake/Output Summary (Last 24 hours) at 02/07/12 1039 Last data filed at 02/07/12 0902  Gross per 24 hour  Intake    120 ml  Output      0 ml  Net    120 ml   Physical Exam: General: alert, well-developed, and cooperative to examination.  Eyes: conjunctival hemorrhages on Right side almost entirely resolved. Lungs: normal respiratory effort, no accessory muscle use, normal breath sounds, no crackles, and no wheezes. Heart: normal rate, regular rhythm, no murmur, no gallop, and no rub.  Abdomen: soft, non-tender, normal bowel sounds, no distention, no guarding   Extremities: No cyanosis, clubbing, edema Neurologic: alert & oriented X3, cranial nerves II-XII intact, strength normal in all extremities, sensation intact to light touch Skin: turgor normal and no rashes.  Psych: Oriented X3, memory intact for recent and remote, normally interactive, good eye contact, not anxious appearing, and not depressed appearing.   Lab Results: Basic Metabolic Panel:  Lab 02/07/12 0960 02/06/12 0610 02/05/12 1756  NA 137 141 --  K 3.6 3.4* --  CL 100 101 --  CO2 26 27 --  GLUCOSE 88 63* --  BUN 10 16 --  CREATININE 0.43* 0.50 --  CALCIUM 8.9 8.9 --  MG 1.7 -- 1.8  PHOS -- -- --   Liver Function Tests:  Lab 02/05/12 1355  AST 16  ALT 11  ALKPHOS 105  BILITOT 0.2*  PROT 6.3    ALBUMIN 3.7   CBC:  Lab 02/07/12 0630 02/06/12 0610 02/05/12 1355  WBC 4.9 4.8 --  NEUTROABS -- -- 3.6  HGB 14.8 13.9 --  HCT 43.6 42.6 --  MCV 97.5 100.9* --  PLT 183 251 --    Lab 02/06/12 1001  GLUCAP 88   Urine Drug Screen: Drugs of Abuse     Component Value Date/Time   LABOPIA NONE DETECTED 02/06/2012 0930   COCAINSCRNUR NONE DETECTED 02/06/2012 0930   LABBENZ NONE DETECTED 02/06/2012 0930   AMPHETMU NONE DETECTED 02/06/2012 0930   THCU NONE DETECTED 02/06/2012 0930   LABBARB NONE DETECTED 02/06/2012 0930    Alcohol Level:  Lab 02/06/12 0913  ETH <11   Urinalysis: No results found for this basename: COLORURINE:2,APPERANCEUR:2,LABSPEC:2,PHURINE:2,GLUCOSEU:2,HGBUR:2,BILIRUBINUR:2,KETONESUR:2,PROTEINUR:2,UROBILINOGEN:2,NITRITE:2,LEUKOCYTESUR:2 in the last 168 hours Misc. Labs: EEG - negative   Studies/Results: Dg Chest 2 View  02/06/2012  *RADIOLOGY REPORT*  Clinical Data: History of abnormal chest x-ray.  CHEST - 2 VIEW  Comparison: CT chest 10/18/2004.  Findings: Lungs are emphysematous.  Nodular opacity projecting in the right mid lung is again seen and correlates with finding on CT scan from 2005.  The lungs are otherwise clear.  No pneumothorax or pleural effusion.  Heart size normal.  IMPRESSION:  1.  No acute finding.  No change in nodular opacity in the right mid lung.  2.  Emphysema.  Original Report Authenticated By: Bernadene Bell. Maricela Curet, M.D.   Ct Head Wo Contrast  02/05/2012  *RADIOLOGY REPORT*  Clinical Data: Pain.  Witnessed seizure with postictal.  No prior history of seizures.  Sudden onset of dizziness.  CT HEAD WITHOUT CONTRAST  Technique:  Contiguous axial images were obtained from the base of the skull through the vertex without contrast.  Comparison: CT head without contrast 08/23/2010.  Findings: No acute cortical infarct, hemorrhage, mass lesion is present.  The ventricles are of normal size.  No significant extra- axial fluid collection is present.  Vascular  calcifications are noted within the cavernous carotid arteries bilaterally.  The paranasal sinuses and mastoid air cells are clear.  The osseous skull is intact.  IMPRESSION:  1.  Normal CT appearance the brain. 2.  Atherosclerosis.  Original Report Authenticated By: Jamesetta Orleans. MATTERN, M.D.   Mr Laqueta Jean Contrast  02/06/2012  *RADIOLOGY REPORT*  Clinical Data: Seizure yesterday.  Memory loss.  Weakness.  MRI HEAD WITH CONTRAST  Technique:  Multiplanar, multiecho pulse sequences of the brain and surrounding structures were obtained according to standard protocol with intravenous contrast  Contrast:  11 ml Multihance.  Comparison: CT head without contrast 02/05/2012.  Findings: The diffusion weighted images demonstrate no evidence for acute or subacute infarction.  Scattered irregular subcortical T2 and FLAIR hyperintensities are present bilaterally.  There is no associated enhancement.  The study is mildly degraded by patient motion.  Flow is present in the major intracranial arteries.  The patient is status post bilateral lens extractions.  The globes and orbits are otherwise intact.  Mild mucosal thickening is present in the maxillary sinuses bilaterally.  The remaining paranasal sinuses are clear.  There is some fluid in the mastoid air cells bilaterally. No obstructing nasopharyngeal lesion is evident.  IMPRESSION:  1.  No acute or focal abnormality to explain the patient's seizures or memory loss. 2.  She show scattered white matter lesions. The finding is nonspecific but can be seen in the setting of chronic microvascular ischemia, a demyelinating process such as multiple sclerosis, vasculitis, complicated migraine headaches, or as the sequelae of a prior infectious or inflammatory process.  Original Report Authenticated By: Jamesetta Orleans. MATTERN, M.D.   Medications: I have reviewed the patient's current medications. Scheduled Meds:   . dextrose  1 ampule Intravenous Once  . enoxaparin  40 mg  Subcutaneous q1800  . escitalopram  20 mg Oral Daily  . folic acid  1 mg Oral Daily  . gadobenate dimeglumine  11 mL Intravenous Once  . levETIRAcetam  500 mg Oral BID  . mulitivitamin with minerals  1 tablet Oral Daily  . naproxen  250 mg Oral BID WC  . nicotine  21 mg Transdermal Daily  . potassium chloride  40 mEq Oral Once  . Teriparatide (Recombinant)  20 mcg Subcutaneous Daily  . thiamine  100 mg Oral Daily   Or  . thiamine  100 mg Intravenous Daily  . DISCONTD: Teriparatide (Recombinant)  20 mcg Subcutaneous Daily   Continuous Infusions:   . sodium chloride 125 mL/hr (02/07/12 1035)   PRN Meds:.LORazepam, LORazepam, ondansetron (ZOFRAN) IV, ondansetron  Assessment/Plan:  1) Seizure: stable, no other episodes since admission. Unclear etiology (head CT, brain MRI, EEG unrevealing).  Per Neurology (Dr. 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  EMS records are not available at [facility].  2) Prolonged QTc: QTc 526  ms on admission, 503 on repeat 12-lead EKG (02/07/12). Could be due to hypokalemia (was 2.8) or hypomagnesemia (1.8) on admission but K is now 3.6 after providing 40 mEq KCl. - Continue telemetry to monitor (patient at risk for Tors[place] de Pointes activity)    3) Alcoholism: Admits to being alcohol-dependent. Nurse reports episode of withdrawal symptoms last night (sweating, tachycardia, vomiting, headache, elevated BP) and was treated with lorazepam per CIWA protocol.  Denies prior history of withdrawal symptoms. Thiamine given. Normal LFTs, normal Mg (2 g given IV also). Refused counseling.  - CIWA protocol  - Low dose clonidine patch for withdrawal symptoms (including persistently elevated high BP since admission)  4) Smoking: 40 pack-year smoking history.  - Nicotine patch to prevent withdrawal symptoms.  -Tobacco cessation counseling   5) Osteoporosis: Diagnosed by endocrinologist,  Dr. Corwin Levins.  DEXA on 08/23/11: femoral neck T score -2.7, lumbar T score -2.0, forearm T score -4.2. She has vitamin D deficiency (level 43.7 on 01/24/12) and has suffered multiple fractures, including pelvic and foot. Workup with [facility] was normal (level 71.8 in November 2012).  - Continue home dose teriparatide Fortera   6) Hx of right nodular opacity: Present on CXR 01/10/12. Patient still has a cough. 2-view chest x-ray reveals unchanged pulmonary nodule.  - Chest CT to better evaluate nodule to assess for change since prior CT     LOS: 2 days   This is a Psychologist, occupational Note.  The care of the patient was discussed with Dr. Meredith Pel and the assessment and plan formulated with their assistance.  Please see their attached note for official documentation of the daily encounter.  Aleene Davidson 02/07/2012, 10:39 AM
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
## 2012-02-07 NOTE — Progress Notes (Signed)
Clinical Social Work Department BRIEF PSYCHOSOCIAL ASSESSMENT 02/07/2012  Patient:  Connie Key, Connie Key     Account Number:  [id number]     Admit date:  02/05/2012  Clinical Social Worker:  Lourdes Sledge  Date/Time:  02/07/2012 11:18 AM  Referred by:  Physician  Date Referred:  02/07/2012 Referred for  Substance Abuse   Other Referral:   Interview type:  Patient Other interview type:    PSYCHOSOCIAL DATA Living Status:  SIGNIFICANT OTHER Admitted from facility:   Level of care:   Primary support name:  Delle Reining Primary support relationship to patient:  FRIEND Degree of support available:   Delle Reining [phone] is pt boyfriend. Pt states he is supportive however drinks occasionally as well.    CURRENT CONCERNS Current Concerns  Substance Abuse   Other Concerns:    SOCIAL WORK ASSESSMENT / PLAN Covering CSW received referral for substance abuse. CSW observed in H&P pt not interested in substance abuse resources as she would like to stop drinking on her own. CSW visited pt room just to inform her that social work services are available and CSW would like to provide pt with resources if pt interested. CSW informed pt that it was her decision whether she wanted to discuss her susbstance abuse. Pt was open to speak to CSW. CSW explored pt history of alcohol abuse, how much pt drinks a day and pt thoughts as to why she turns to alcohol. CSW encouraged pt to consider outpatient counseling to manage symptoms of stress related to her job. CSW also encouraged pt to identify ways she could cope with stress. CSW informed pt where she could take up swimming which would assist with her stress and her health. Pt declined pursuing substance abuse counseling at this time however states she will consider it in the future. At this moment no further CSW needs addressed. CSW is signing off.   Assessment/plan status:  No Further Intervention Required Other assessment/ plan:   Information/referral  to community resources:   CSW provided pt with resources for substance abuse and CSW contact information if she needs further assistance while in the hospital.    PATIENT'S/FAMILY'S RESPONSE TO PLAN OF CARE: CSW visited pt room where pt boyfriend Iona Hansen was present. CSW asked to speak to pt alone. Pt was laying in bed, alert, oriented and very pleasant to speak to. Pt stated she did not mind CSW visite and discussing her substance abuse. Pt stated she wasnt sure if she had a substsnace abuse problem however assumed she probably did. Pt stated shes been drinking since a very young age however has stopped drinking several times during her life. Pt stated she began drinking again 2 years ago when her job of 30years became more stressful. Pt states she takes 2 shots a few times a week when she gets home which helps her relax and forget about the day. Pt stated her boyfriend drinks a few beers a day. Pt stated she enjoys her job however because of her job she deals with upset customers very often. Pt stated she knows of other ways to cope stress however she is unable to exercise but can swim. Pt was receptive to taking up swimming and stated she would look into that. Pt received resources and considering going to counseling in the future. Pt did not have any questions however appreciated CSW visit.

## 2012-02-07 NOTE — Progress Notes (Signed)
Pt experienced withdrawal symptoms c/o headache, vomited x1 and high BP when checked. Pt also seemed confused and took time answering questions at the time and sweating. Pt became alert and oriented x4; PRN ativan given and pt BP decreased to the 150's. Pt sleeping quietly in bed without any complaints or notice of vomiting.

## 2012-02-07 NOTE — Progress Notes (Signed)
Subjective: No recurrence of seizure activity no indications of seizure aura with visual changes. Patient had no complaints.  Objective: Current vital signs: BP 155/92  Pulse 87  Temp(Src) 98.7 F (37.1 C) (Oral)  Resp 24  Ht 5\' 6"  (1.676 m)  Wt 58.06 kg (128 lb)  BMI 20.66 kg/m2  SpO2 94%  Neurologic Exam: Alert with normal mental status.  Lab Results: EEG was normal. MRI showed no acute changes. Nonspecific white matter changes were noted.  Medications:  Scheduled:   . dextrose  1 ampule Intravenous Once  . enoxaparin  40 mg Subcutaneous q1800  . escitalopram  20 mg Oral Daily  . folic acid  1 mg Oral Daily  . gadobenate dimeglumine  11 mL Intravenous Once  . mulitivitamin with minerals  1 tablet Oral Daily  . naproxen  250 mg Oral BID WC  . nicotine  21 mg Transdermal Daily  . potassium chloride  40 mEq Oral Once  . Teriparatide (Recombinant)  20 mcg Subcutaneous Daily  . thiamine  100 mg Oral Daily   Or  . thiamine  100 mg Intravenous Daily  . DISCONTD: Teriparatide (Recombinant)  20 mcg Subcutaneous Daily  . DISCONTD: Teriparatide (Recombinant)  600 mcg Subcutaneous Daily    Assessment/Plan: New-onset seizure disorder with partial seizures consisting of visual changes, and one episode secondary generalization.  Recommendations: 1. Anticonvulsant treatment with Keppra 500 mg twice a day. 2. No further neurodiagnostic studies are indicated. 3. Followup in 2-3 weeks following discharge from the hospital with [facility] or [facility].  Neurology sign off at this point. We will remain available for followup if needed during hospitalization.  C.R. Roseanne Reno, MD [facility] [phone]  02/07/2012  8:42 AM

## 2012-02-07 NOTE — Progress Notes (Signed)
Internal Medicine Attending  Date: 02/07/2012  Patient name: Connie Key Medical record number: [id number] Date of birth: [birth year] Age: 61 y.o. Gender: female  I saw and evaluated the patient, and discussed her care with house staff.  Patient has had no further seizures.  Her MRI showed nonspecific white matter changes, and neurology has recommended treatment with Keppra and no further diagnostic workup.  Chest x-ray showed a nodular opacity projecting in the right mid lung that correlates with a finding on CT scan from 2005; plan is to obtain a CT scan of the chest to evaluate.  Patient received oral Ativan early this morning for apparent withdrawal symptoms; will continue CIWA protocol, and given her moderately elevated blood pressures would also add a low-dose clonidine patch.

## 2012-02-08 ENCOUNTER — Other Ambulatory Visit: Payer: Self-pay | Admitting: Internal Medicine

## 2012-02-08 LAB — BASIC METABOLIC PANEL
BUN: 9 mg/dL (ref 6–23)
Chloride: 101 mEq/L (ref 96–112)
Glucose, Bld: 92 mg/dL (ref 70–99)
Potassium: 3.6 mEq/L (ref 3.5–5.1)

## 2012-02-08 LAB — CBC
HCT: 43.8 % (ref 36.0–46.0)
Hemoglobin: 14.8 g/dL (ref 12.0–15.0)
WBC: 4.6 10*3/uL (ref 4.0–10.5)

## 2012-02-08 MED ORDER — LEVETIRACETAM 500 MG PO TABS: 500.0000 mg | ORAL_TABLET | Freq: Two times a day (BID) | ORAL | Status: DC

## 2012-02-08 MED ORDER — THIAMINE HCL 100 MG PO TABS: 100.0000 mg | ORAL_TABLET | Freq: Every day | ORAL | Status: DC

## 2012-02-08 MED ORDER — FOLIC ACID 1 MG PO TABS: 1.0000 mg | ORAL_TABLET | Freq: Every day | ORAL | Status: DC

## 2012-02-08 MED ORDER — CLONIDINE HCL 0.1 MG/24HR TD PTWK: 1.0000 | MEDICATED_PATCH | TRANSDERMAL | Status: AC

## 2012-02-08 MED ORDER — FOLIC ACID 1 MG PO TABS: 1.0000 mg | ORAL_TABLET | Freq: Every day | ORAL | Status: AC

## 2012-02-08 MED ORDER — THIAMINE HCL 100 MG PO TABS: 100.0000 mg | ORAL_TABLET | Freq: Every day | ORAL | Status: AC

## 2012-02-08 MED ORDER — CLONIDINE HCL 0.1 MG/24HR TD PTWK: 1.0000 | MEDICATED_PATCH | TRANSDERMAL | Status: DC

## 2012-02-08 NOTE — Progress Notes (Signed)
Resident Co-sign Daily Note: I have seen the patient and reviewed the daily progress note by MS-4 Prince Rome and discussed the care of the patient with him.  See below for documentation of my findings, assessment, and plans.  Subjective: No acute event overnight, no symptoms of withdrawal, last ativan dose was on 02/07/12 @5AM .  Patient wants to go home.  Denies any chest pain, SOB. Objective: Vital signs in last 24 hours: Filed Vitals:   02/07/12 1403 02/07/12 1406 02/07/12 2200 02/08/12 0600  BP: 139/104 145/102 151/89 153/95  Pulse: 130 128 89 87  Temp:   98.3 F (36.8 C) 98.3 F (36.8 C)  TempSrc:   Oral Oral  Resp:   20 20  Height:      Weight:      SpO2:   96% 94%   Physical Exam: General: alert, well-developed, and cooperative to examination.  Eyes: conjunctival hemorrhages resolved  Lungs: normal respiratory effort, no accessory muscle use, normal breath sounds, no crackles, and no wheezes. Heart: normal rate, regular rhythm, no murmur, no gallop, and no rub.  Abdomen: soft, non-tender, normal bowel sounds, no distention, no guarding  Extremities: No cyanosis, clubbing, edema Neurologic: alert & oriented X3, cranial nerves II-XII intact, strength normal in all extremities, sensation intact to light touch  Psych: Oriented X3, memory intact for recent and remote, normally interactive, good eye contact, slightly anxious appearing    Lab Results: Reviewed and documented in Electronic Record Micro Results: Reviewed and documented in Electronic Record Studies/Results: Reviewed and documented in Electronic Record Medications:  Scheduled Meds:   . cloNIDine  0.1 mg Transdermal Weekly  . enoxaparin  40 mg Subcutaneous q1800  . escitalopram  20 mg Oral Daily  . folic acid  1 mg Oral Daily  . levETIRAcetam  500 mg Oral BID  . mulitivitamin with minerals  1 tablet Oral Daily  . naproxen  250 mg Oral BID WC  . nicotine  21 mg Transdermal Daily  . Teriparatide (Recombinant)   20 mcg Subcutaneous Daily  . thiamine  100 mg Oral Daily   Or  . thiamine  100 mg Intravenous Daily  . DISCONTD: dextrose  1 ampule Intravenous Once   Continuous Infusions:   . DISCONTD: sodium chloride 125 mL/hr (02/07/12 1035)   PRN Meds:.iohexol, LORazepam, LORazepam, ondansetron (ZOFRAN) IV, ondansetron Assessment/Plan: 1) Seizure: stable, no other episodes since admission. Unclear etiology (head CT, brain MRI, EEG unrevealing). Per Neurology (Dr. 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  -will vontinue Keppra 500mg  po bid  -Patient will follow up with outpatient neurology   2) Prolonged QTc: Stable  3) Alcoholism: stable, so signs & symptoms of withdrawal and last Ativan dose was over 24 hours ago.  Will continue to counsel on alcohol cessation.  4) Smoking: 40 pack-year smoking history.  - Nicotine patch to prevent withdrawal symptoms.  -Tobacco cessation counseling   5) Osteoporosis: stable, however, patient reports palpitation and visual disturbances since starting Forteo. I am not sure if her symptoms are results of side effects from this medication.  Will ask patient to hold Forteo until she follows up with her endocrinologist.     6) Hx of right nodular opacity: After reviewing her imaging, patient had CT of chest in 2005 which showed a right sided nodular density and had follow up PET scan which was thought to be post-inflammatory. She has not had any recent CT follow since 2005. Chest  Xray on 3/7 and 4/3 again demonstrated the right sided nodule. Given her history of smoking, malignancy is in the differential.  CT chest showed stable nodule of right lung.  Will have her PCP follow up yearly as outpatient   Dispo: Home today      LOS: 3 days   Jayzen Paver 02/08/2012, 10:45 AM
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
## 2012-02-08 NOTE — Progress Notes (Signed)
Pts assessment unchanged from this am. Discharged with friend to private vehicle via wheelchair

## 2012-02-08 NOTE — Progress Notes (Signed)
Medical Student Daily Progress Note  Subjective: Doing well, no acute events overnight.  Namely, no more alcohol withdrawal symptoms and no ativan given.  There were some events of sinus tachycardia on telemetry that corresponded to increased activity.  Objective: Vital signs in last 24 hours: Filed Vitals:   02/07/12 1403 02/07/12 1406 02/07/12 2200 02/08/12 0600  BP: 139/104 145/102 151/89 153/95  Pulse: 130 128 89 87  Temp:   98.3 F (36.8 C) 98.3 F (36.8 C)  TempSrc:   Oral Oral  Resp:   20 20  Height:      Weight:      SpO2:   96% 94%    Intake/Output Summary (Last 24 hours) at 02/08/12 0951 Last data filed at 02/07/12 1700  Gross per 24 hour  Intake    480 ml  Output      0 ml  Net    480 ml  (no foley, has been flushing urine in toilet)  Physical Exam: General: alert, well-developed, and cooperative to examination.  Eyes: conjunctival hemorrhages on Right side almost entirely resolved.  Lungs: normal respiratory effort, no accessory muscle use, normal breath sounds, no crackles, and no wheezes. Heart: normal rate, regular rhythm, no murmur, no gallop, and no rub.  Abdomen: soft, non-tender, normal bowel sounds, no distention, no guarding  Extremities: No cyanosis, clubbing, edema Neurologic: alert & oriented X3, cranial nerves II-XII intact, strength normal in all extremities, sensation intact to light touch  Psych: Oriented X3, memory intact for recent and remote, normally interactive, good eye contact, slightly anxious appearing,   Lab Results: Basic Metabolic Panel:  Lab 02/08/12 1610 02/07/12 0630 02/05/12 1756  NA 139 137 --  K 3.6 3.6 --  CL 101 100 --  CO2 28 26 --  GLUCOSE 92 88 --  BUN 9 10 --  CREATININE 0.51 0.43* --  CALCIUM 9.4 8.9 --  MG -- 1.7 1.8  PHOS -- -- --   Liver Function Tests:  Lab 02/05/12 1355  AST 16  ALT 11  ALKPHOS 105  BILITOT 0.2*  PROT 6.3  ALBUMIN 3.7   CBC:  Lab 02/08/12 0650 02/07/12 0630 02/05/12 1355  WBC  4.6 4.9 --  NEUTROABS -- -- 3.6  HGB 14.8 14.8 --  HCT 43.8 43.6 --  MCV 97.1 97.5 --  PLT 199 183 --   CBG:  Lab 02/06/12 1001  GLUCAP 88    Urine Drug Screen: Drugs of Abuse     Component Value Date/Time   LABOPIA NONE DETECTED 02/06/2012 0930   COCAINSCRNUR NONE DETECTED 02/06/2012 0930   LABBENZ NONE DETECTED 02/06/2012 0930   AMPHETMU NONE DETECTED 02/06/2012 0930   THCU NONE DETECTED 02/06/2012 0930   LABBARB NONE DETECTED 02/06/2012 0930    Alcohol Level:  Lab 02/06/12 0913  ETH <11   Urinalysis: No results found for this basename: COLORURINE:2,APPERANCEUR:2,LABSPEC:2,PHURINE:2,GLUCOSEU:2,HGBUR:2,BILIRUBINUR:2,KETONESUR:2,PROTEINUR:2,UROBILINOGEN:2,NITRITE:2,LEUKOCYTESUR:2 in the last 168 hours  Studies/Results: Dg Chest 2 View  02/06/2012  *RADIOLOGY REPORT*  Clinical Data: History of abnormal chest x-ray.  CHEST - 2 VIEW  Comparison: CT chest 10/18/2004.  Findings: Lungs are emphysematous.  Nodular opacity projecting in the right mid lung is again seen and correlates with finding on CT scan from 2005.  The lungs are otherwise clear.  No pneumothorax or pleural effusion.  Heart size normal.  IMPRESSION:  1.  No acute finding.  No change in nodular opacity in the right mid lung. 2.  Emphysema.  Original Report Authenticated By: Bernadene Bell. D'ALESSIO,  M.D.   Ct Chest W Contrast  02/07/2012  *RADIOLOGY REPORT*  Clinical Data: Follow up right lung nodule, present in 2005  CT CHEST WITH CONTRAST  Technique:  Multidetector CT imaging of the chest was performed following the standard protocol during bolus administration of intravenous contrast.  Contrast:  80 ml Omnipaque-300 IV  Comparison: [facility] chest radiographs dated 02/06/2011.  [facility] PET CT dated 12/01/2004.  Findings: 8 x 20 mm nodule in the superior aspect of the right middle lobe (series 3/image 30).  Adjacent 7 x 13 mm nodule (series 3/image 31).  Additional nodularity along the right major fissure (series 3/image 23).   This appearance is unchanged from prior PET CT, benign.  Stable subpleural ground-glass nodule in the lateral left lower lobe (series 3/image 43).  Additional stable subpleural nodule in the lateral left lung base (series 3/image 51.  Given long-term stability, these are also benign.  Right apical pleural parenchymal scarring.  Underlying centrilobular emphysematous changes.  No new/suspicious pulmonary nodules.  No pleural effusion or pneumothorax.  Visualized thyroid is unremarkable.  The heart is normal in size.  No pericardial effusion. Atherosclerotic calcifications of the aortic arch.  No suspicious mediastinal, hilar, or axillary lymphadenopathy.  Bilateral breast augmentation.  Visualized upper abdomen is notable for heterogeneous perfusion of the spleen.  Visualized osseous structures are within normal limits.  IMPRESSION: Stable nodularity in the right middle lobe/along the right major fissure.  Stable subpleural nodules in the left lower lobe.  These findings are unchanged from 2006, benign.  No new/suspicious pulmonary nodules.  Underlying centrilobular emphysematous changes.  Original Report Authenticated By: Charline Bills, M.D.   Mr Laqueta Jean Contrast  02/06/2012  *RADIOLOGY REPORT*  Clinical Data: Seizure yesterday.  Memory loss.  Weakness.  MRI HEAD WITH CONTRAST  Technique:  Multiplanar, multiecho pulse sequences of the brain and surrounding structures were obtained according to standard protocol with intravenous contrast  Contrast:  11 ml Multihance.  Comparison: CT head without contrast 02/05/2012.  Findings: The diffusion weighted images demonstrate no evidence for acute or subacute infarction.  Scattered irregular subcortical T2 and FLAIR hyperintensities are present bilaterally.  There is no associated enhancement.  The study is mildly degraded by patient motion.  Flow is present in the major intracranial arteries.  The patient is status post bilateral lens extractions.  The globes and  orbits are otherwise intact.  Mild mucosal thickening is present in the maxillary sinuses bilaterally.  The remaining paranasal sinuses are clear.  There is some fluid in the mastoid air cells bilaterally. No obstructing nasopharyngeal lesion is evident.  IMPRESSION:  1.  No acute or focal abnormality to explain the patient's seizures or memory loss. 2.  She show scattered white matter lesions. The finding is nonspecific but can be seen in the setting of chronic microvascular ischemia, a demyelinating process such as multiple sclerosis, vasculitis, complicated migraine headaches, or as the sequelae of a prior infectious or inflammatory process.  Original Report Authenticated By: Jamesetta Orleans. MATTERN, M.D.   Medications: I have reviewed the patient's current medications. Scheduled Meds:    . cloNIDine  0.1 mg Transdermal Weekly  . enoxaparin  40 mg Subcutaneous q1800  . escitalopram  20 mg Oral Daily  . folic acid  1 mg Oral Daily  . levETIRAcetam  500 mg Oral BID  . mulitivitamin with minerals  1 tablet Oral Daily  . naproxen  250 mg Oral BID WC  . nicotine  21 mg Transdermal Daily  .  Teriparatide (Recombinant)  20 mcg Subcutaneous Daily  . thiamine  100 mg Oral Daily   Or  . thiamine  100 mg Intravenous Daily  . DISCONTD: dextrose  1 ampule Intravenous Once   Continuous Infusions:    . DISCONTD: sodium chloride 125 mL/hr (02/07/12 1035)   PRN Meds:.iohexol, LORazepam, LORazepam, ondansetron (ZOFRAN) IV, ondansetron Assessment/Plan:  1) Seizure: Stable, no other episodes since admission. Unclear etiology (head CT, brain MRI, EEG unrevealing). Per Neurology (Dr. 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  Keppra 500mg  po bid started 02/07/12.  - Patient will follow up with outpatient neurology   2) Prolonged QTc: No events on telemetry.  Improved QTc 526 ms on admission to 503 on repeat 12-lead EKG  (02/07/12). Likely due to hypokalemia (was 2.8) or hypomagnesemia (1.8) on admission but K is now 3.6 after providing 40 mEq KCl.  - Continue telemetry to monitor (patient at risk for T[place] Pointes activity)   3) Alcoholism: Admits to being alcohol-dependent. Nurse reports episode of withdrawal symptoms the morning of 02/07/12 (sweating, tachycardia, vomiting, headache, elevated BP) and was treated with lorazepam per CIWA protocol. Denies prior history of withdrawal symptoms. Thiamine given. Normal LFTs, normal Mg (2 g given IV also). Refused counseling.  Daily 0.1 mg clonidine patch started 02/07/12 for withdrawal symptoms (including persistently elevated high BP since admission), she may or may not need longterm once her withdrawal symptoms resolve and if her blood pressure normalizes.  - CIWA protocol   4) Smoking: 40 pack-year smoking history.  - Nicotine patch to prevent withdrawal symptoms.  -Tobacco cessation counseling   5) Osteoporosis: Stable, will continue home dose teriparatide.   6) Hx of right nodular opacity: After reviewing her imaging, patient had CT of chest in 2005 which showed a right sided nodular density and had follow up PET scan which was thought to be post-inflammatory. Chest Xray on 3/7 and 4/3 again demonstrated the right sided nodule. Given her history of smoking, malignancy is in the differential.  Follow up chest CT on4/4/13 revealed stable benign radiographic characterization of nodule.  Dispo: Likely today being that she is stably doing well, notedly she has not had any further acute alcohol withdrawal symptoms over the past 24 hours.  Her visual disturbances are likely partial seizures with her presenting complaint being related to secondary generalization of a partial seizure.  She was started on Keppra for seizure prevention and clonidine for control of alcohol withdrawal symptoms.  Social work provided Ms. Shipp with information of alcohol cessation.  Ms. Blixt  will follow up as an outpatient with internal medicine primary care, ophthalmology, and neurology.    LOS: 3 days   This is a Psychologist, occupational Note.  The care of the patient was discussed with Dr. Meredith Pel  and the assessment and plan formulated with their assistance.  Please see their attached note for official documentation of the daily encounter.  Aleene Davidson 02/08/2012, 9:51 AM

## 2012-02-19 ENCOUNTER — Encounter: Payer: 59 | Admitting: Internal Medicine

## 2012-02-28 ENCOUNTER — Ambulatory Visit: Payer: 59 | Admitting: Neurology

## 2012-03-12 ENCOUNTER — Other Ambulatory Visit (HOSPITAL_COMMUNITY): Payer: Self-pay | Admitting: Emergency Medicine

## 2012-03-12 DIAGNOSIS — R531 Weakness: Secondary | ICD-10-CM

## 2012-05-15 ENCOUNTER — Telehealth: Payer: Self-pay | Admitting: Internal Medicine

## 2012-05-15 NOTE — Telephone Encounter (Signed)
Ok OV tomorrow. Thx 

## 2012-05-15 NOTE — Telephone Encounter (Signed)
Gave pt/phone 05/16/12 @ 745A w/dr Roena Malady

## 2012-05-15 NOTE — Telephone Encounter (Signed)
Pt says she is your pt--she has [employer] insurance per pt---Last acute visit with you is 07/31/2005--Can this pt re establish with you as a new pt-- She went to urg care day before yesterday after hitting her elbow which is red and swollen.  She want you to look at this elbow.  Thank you for your reply

## 2012-05-16 ENCOUNTER — Ambulatory Visit (INDEPENDENT_AMBULATORY_CARE_PROVIDER_SITE_OTHER): Payer: 59 | Admitting: Internal Medicine

## 2012-05-16 ENCOUNTER — Other Ambulatory Visit: Payer: 59

## 2012-05-16 ENCOUNTER — Encounter: Payer: Self-pay | Admitting: Internal Medicine

## 2012-05-16 VITALS — BP 140/88 | HR 72 | Temp 98.2°F | Resp 16 | Ht 66.0 in | Wt 128.0 lb

## 2012-05-16 DIAGNOSIS — M7022 Olecranon bursitis, left elbow: Secondary | ICD-10-CM

## 2012-05-16 DIAGNOSIS — IMO0002 Reserved for concepts with insufficient information to code with codable children: Secondary | ICD-10-CM

## 2012-05-16 DIAGNOSIS — M7020 Olecranon bursitis, unspecified elbow: Secondary | ICD-10-CM

## 2012-05-16 DIAGNOSIS — T07XXXA Unspecified multiple injuries, initial encounter: Secondary | ICD-10-CM

## 2012-05-16 DIAGNOSIS — L03114 Cellulitis of left upper limb: Secondary | ICD-10-CM

## 2012-05-16 DIAGNOSIS — M7989 Other specified soft tissue disorders: Secondary | ICD-10-CM

## 2012-05-16 MED ORDER — DOXYCYCLINE HYCLATE 100 MG PO TABS: 100.0000 mg | ORAL_TABLET | Freq: Two times a day (BID) | ORAL | Status: AC

## 2012-05-16 MED ORDER — IBUPROFEN 600 MG PO TABS: ORAL_TABLET | ORAL | Status: AC

## 2012-05-16 MED ORDER — MUPIROCIN 2 % EX OINT: TOPICAL_OINTMENT | CUTANEOUS | Status: AC

## 2012-05-16 NOTE — Patient Instructions (Addendum)
   Wound instructions : change dressing once a day or twice a day is needed. Change dressing after  shower in the morning.  Pat dry the wound with gauze. Pull out the packing tomorrow. Re-dress wound with antibiotic ointment and Telfa pad or a Band-Aid of appropriate size.   Please contact us if you notice a recollection of pus in the abscess fever and chills increased pain redness red streaks near the abscess increased swelling in the area.   

## 2012-05-16 NOTE — Assessment & Plan Note (Signed)
7/13 infectious vs reactive Cx obtained  See procedure

## 2012-05-16 NOTE — Progress Notes (Signed)
Subjective:    Patient ID: Connie Key, female    DOB: 1951/02/21, 61 y.o.   MRN: [id number]  HPI C/o L elbow injury with a scab: it got infected 2 wks ago (turned red, hot and swollen) - she went to UC on Tue -- 4 cc of ?pus were drained out. It is worse now: red and swollen, tender as well... She is on Doxy po   Review of Systems  Constitutional: Positive for chills. Negative for activity change, appetite change, fatigue and unexpected weight change.  HENT: Negative for congestion, mouth sores and sinus pressure.   Eyes: Negative for visual disturbance.  Respiratory: Negative for cough and chest tightness.   Gastrointestinal: Negative for nausea and abdominal pain.  Genitourinary: Negative for frequency, difficulty urinating and vaginal pain.  Musculoskeletal: Positive for joint swelling (L elbow). Negative for back pain and gait problem.  Skin: Positive for wound. Negative for pallor and rash.  Neurological: Negative for dizziness, tremors, weakness, numbness and headaches.  Psychiatric/Behavioral: Negative for confusion and disturbed wake/sleep cycle.       Objective:   Physical Exam  Constitutional: She appears well-developed. No distress.  HENT:  Head: Normocephalic.  Right Ear: External ear normal.  Left Ear: External ear normal.  Nose: Nose normal.  Mouth/Throat: Oropharynx is clear and moist.  Eyes: Conjunctivae are normal. Pupils are equal, round, and reactive to light. Right eye exhibits no discharge. Left eye exhibits no discharge.  Neck: Normal range of motion. Neck supple. No JVD present. No tracheal deviation present. No thyromegaly present.  Cardiovascular: Normal rate, regular rhythm and normal heart sounds.   Pulmonary/Chest: No stridor. No respiratory distress. She has no wheezes.  Abdominal: Soft. Bowel sounds are normal. She exhibits no distension and no mass. There is no tenderness. There is no rebound and no guarding.  Musculoskeletal: She exhibits  edema and tenderness.       L elbow post aspect is red, swollen, tender. The scab is present  Lymphadenopathy:    She has no cervical adenopathy.  Neurological: She displays normal reflexes. No cranial nerve deficit. She exhibits normal muscle tone. Coordination normal.  Skin: No rash noted. There is erythema.  Psychiatric: She has a normal mood and affect. Her behavior is normal. Judgment and thought content normal.      Procedure Note :    Procedure :   Point of care (POC) sonography examination   Indication: L elbow swelling   Equipment used: Sonosite M-Turbo with HFL38x/13-6 MHz transducer linear probe. The images were stored in the unit and later transferred in storage.  The patient was placed in a sitting position.  This study revealed a hypoechoic large lesion in the post aspect of the L elbow, surrounding edema   Impression: L elbow posterior fluid collection with surrounding tissue edema  Procedure note:  Incision and Drainage of an Abscess   Indication : a localized collection of pus that is tender and not spontaneously resolving.    Risks including unsuccessful procedure , possible need for a repeat procedure due to pus accumulation, scar formation, and others as well as benefits were explained to the patient in detail. Written consent was obtained/signed.    The patient was placed in a decubitus position. The area of an abscess was prepped with povidone-iodine and draped in a sterile fashion. Local anesthesia with   1    cc of 2% lidocaine and epinephrine  was administered.  0.7 cm incision with #11strait blade was made. About  5 cc of non-purulent liquid material was expressed.  The cavity was irrigated with the rest of the anesthetic in the syringe and packed with 1 inch of  the iodoform gauze.   The wound was dressed with antibiotic ointment and Telfa pad. ACE. Tolerated well. Complications: None.   Wound instructions provided.       Assessment & Plan:

## 2012-05-16 NOTE — Assessment & Plan Note (Signed)
Doxy Mupirocin topically

## 2012-05-20 ENCOUNTER — Encounter: Payer: Self-pay | Admitting: Internal Medicine

## 2012-05-20 ENCOUNTER — Ambulatory Visit (INDEPENDENT_AMBULATORY_CARE_PROVIDER_SITE_OTHER): Payer: 59 | Admitting: Internal Medicine

## 2012-05-20 VITALS — BP 114/86 | HR 80 | Temp 98.1°F | Resp 16 | Wt 128.0 lb

## 2012-05-20 DIAGNOSIS — M7022 Olecranon bursitis, left elbow: Secondary | ICD-10-CM

## 2012-05-20 DIAGNOSIS — IMO0002 Reserved for concepts with insufficient information to code with codable children: Secondary | ICD-10-CM

## 2012-05-20 DIAGNOSIS — L03114 Cellulitis of left upper limb: Secondary | ICD-10-CM

## 2012-05-20 DIAGNOSIS — M7020 Olecranon bursitis, unspecified elbow: Secondary | ICD-10-CM

## 2012-05-20 LAB — WOUND CULTURE
Gram Stain: NONE SEEN
Organism ID, Bacteria: NO GROWTH

## 2012-05-20 NOTE — Assessment & Plan Note (Signed)
90% resolved Cont Rx

## 2012-05-20 NOTE — Progress Notes (Signed)
Patient ID: Connie Key, female   DOB: 12/24/1950, 61 y.o.   MRN: [id number]  Subjective:    Patient ID: Connie Key, female    DOB: 08-Mar-1951, 61 y.o.   MRN: [id number]  HPI F/u L elbow injury with a scab: it got infected 3 wks ago (turned red, hot and swollen) - she went to UC on Tue -- 4 cc of ?pus were drained out. It is better now: less red and not swollen, s/p I&D. Cx was neg She is on Doxy and Ibuprofen po   Review of Systems  Constitutional: Negative for chills, activity change, appetite change, fatigue and unexpected weight change.  HENT: Negative for congestion, mouth sores and sinus pressure.   Eyes: Negative for visual disturbance.  Respiratory: Negative for cough and chest tightness.   Gastrointestinal: Negative for nausea and abdominal pain.  Genitourinary: Negative for frequency, difficulty urinating and vaginal pain.  Musculoskeletal: Negative for back pain, joint swelling (L elbow better) and gait problem.  Skin: Positive for wound. Negative for pallor and rash.  Neurological: Negative for dizziness, tremors, weakness, numbness and headaches.  Psychiatric/Behavioral: Negative for confusion and disturbed wake/sleep cycle.       Objective:   Physical Exam  Constitutional: She appears well-developed. No distress.  HENT:  Head: Normocephalic.  Right Ear: External ear normal.  Left Ear: External ear normal.  Nose: Nose normal.  Mouth/Throat: Oropharynx is clear and moist.  Eyes: Conjunctivae are normal. Pupils are equal, round, and reactive to light. Right eye exhibits no discharge. Left eye exhibits no discharge.  Neck: Normal range of motion. Neck supple. No JVD present. No tracheal deviation present. No thyromegaly present.  Cardiovascular: Normal rate, regular rhythm and normal heart sounds.   Pulmonary/Chest: No stridor. No respiratory distress. She has no wheezes.  Abdominal: Soft. Bowel sounds are normal. She exhibits no distension and no mass. There is  no tenderness. There is no rebound and no guarding.  Musculoskeletal: She exhibits edema ( ) and tenderness.       L elbow post aspect is less red, trace swollen, nontender. The scab is not present  Lymphadenopathy:    She has no cervical adenopathy.  Neurological: She displays normal reflexes. No cranial nerve deficit. She exhibits normal muscle tone. Coordination normal.  Skin: No rash noted. No erythema.  Psychiatric: She has a normal mood and affect. Her behavior is normal. Judgment and thought content normal.             Assessment & Plan:

## 2012-05-20 NOTE — Assessment & Plan Note (Signed)
90% resolved

## 2012-05-20 NOTE — Patient Instructions (Addendum)
Pennsaid 1-2 drops 4 times a day

## 2012-07-13 IMAGING — CT CT CHEST W/ CM
3 series · 17 of 29 positions shown, 19 images · IV contrast (80ml omni 300)
Comparison: [HOSPITAL] chest radiographs dated 02/06/2011.  [facility] PET CT dated 12/01/2004.

CLINICAL DATA: Follow up right lung nodule, present in 5880

CT CHEST WITH CONTRAST
TECHNIQUE: Multidetector CT imaging of the chest was performed
following the standard protocol during bolus administration of
intravenous contrast.
Contrast:  80 ml Fmnipaque-366 IV

[Series 2: routine chest · axial · 0.67mm/px · z∈[-314,-84]mm · 7 of 66 slices shown, 9 images]
[im 10/66  mediastinal]
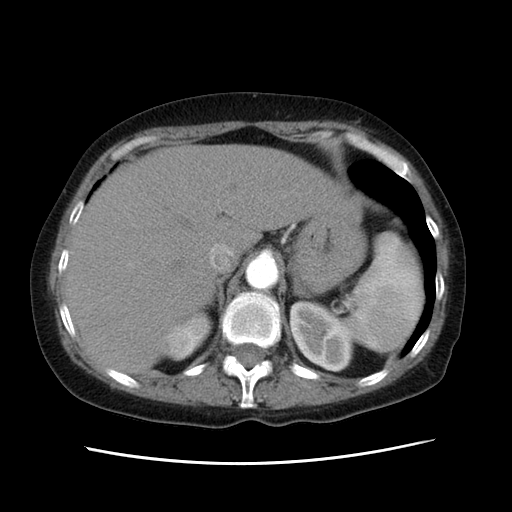
[im 10/66  lung]
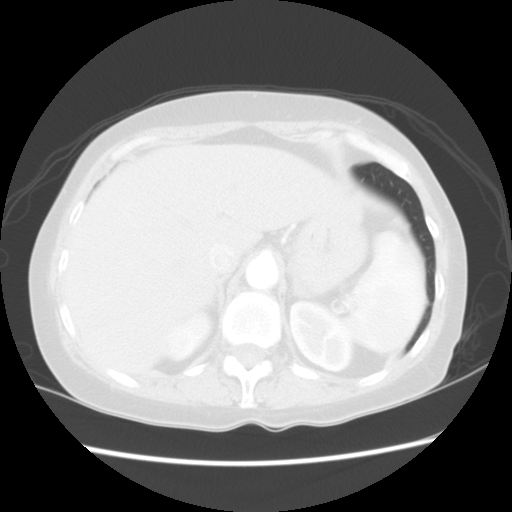
[im 19/66  lung]
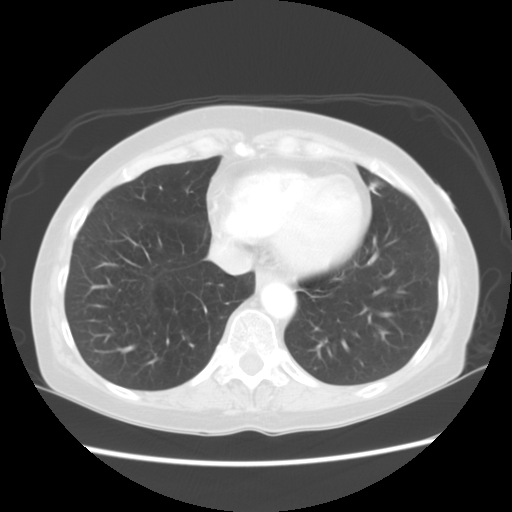
[im 28/66  lung]
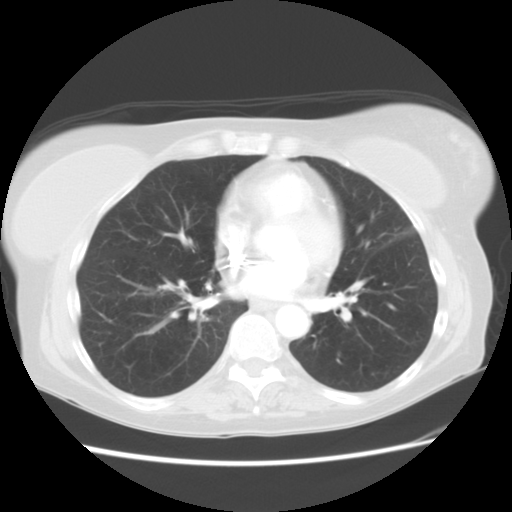
[im 33/66  lung]
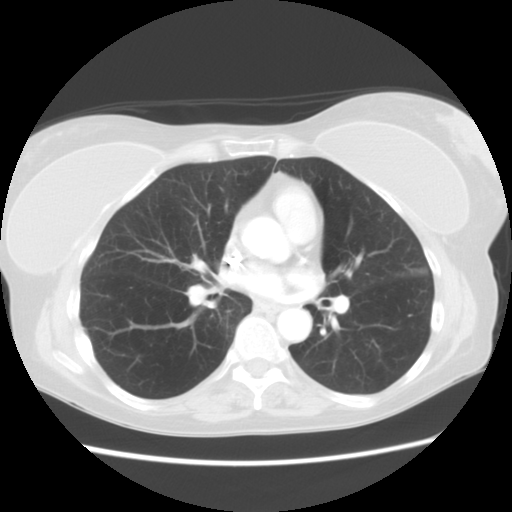
[im 38/66  mediastinal]
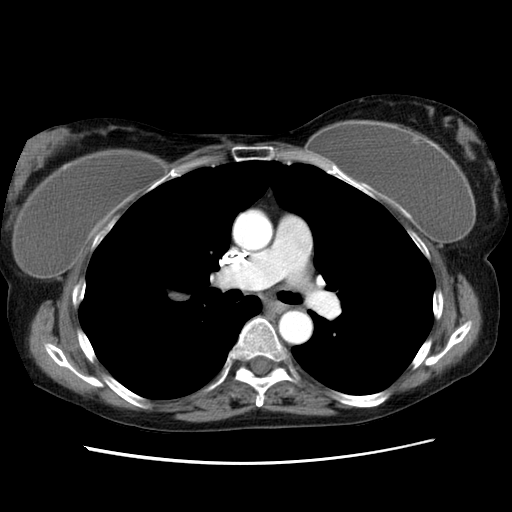
[im 38/66  lung]
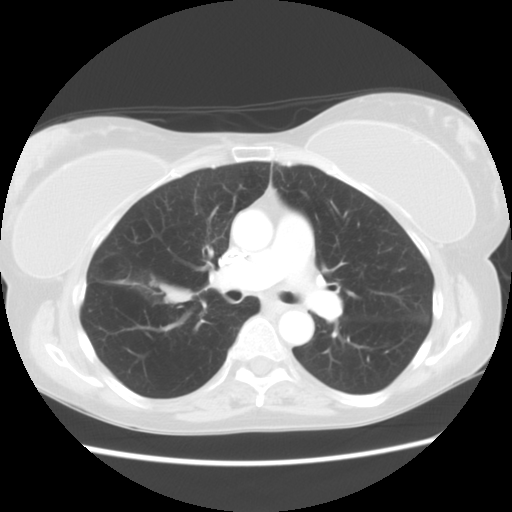
[im 47/66  lung]
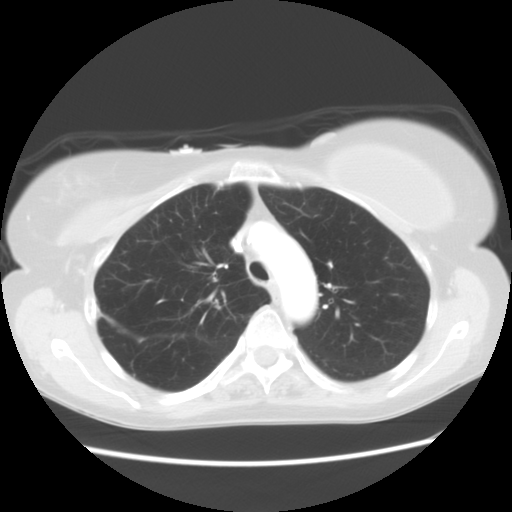
[im 56/66  lung]
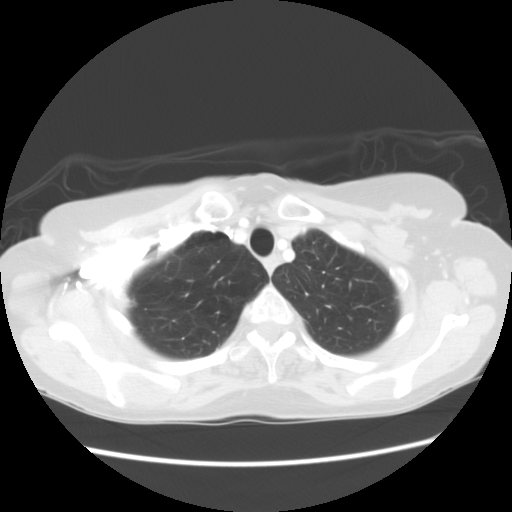

[Series 400: sagittals · sagittal · 0.67mm/px · 8 of 99 slices shown]
[im 10/99  lung]
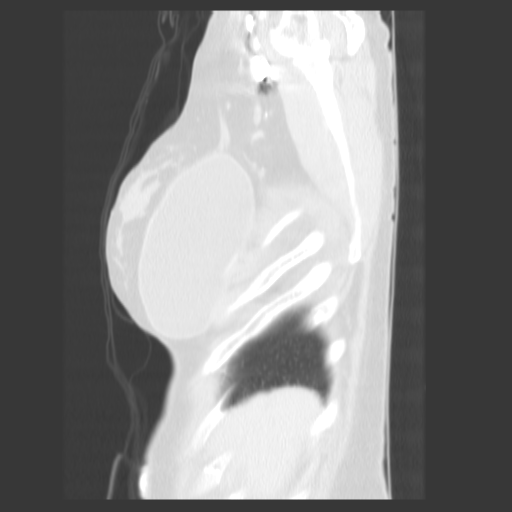
[im 20/99  lung]
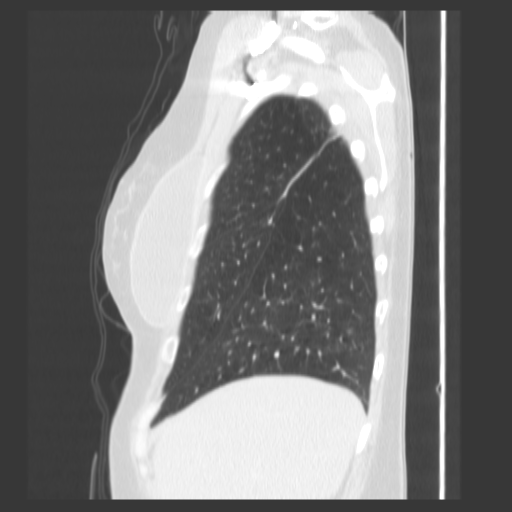
[im 30/99  lung]
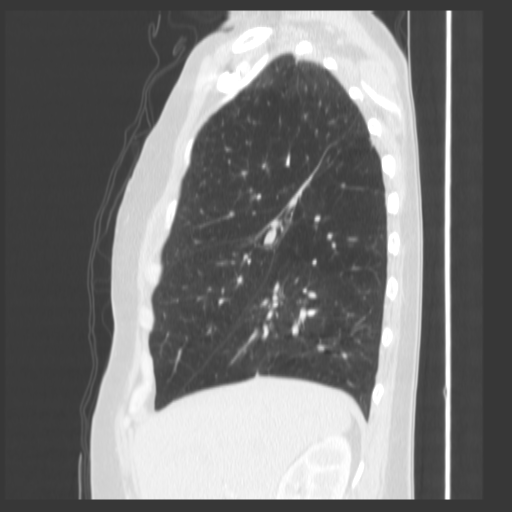
[im 40/99  lung]
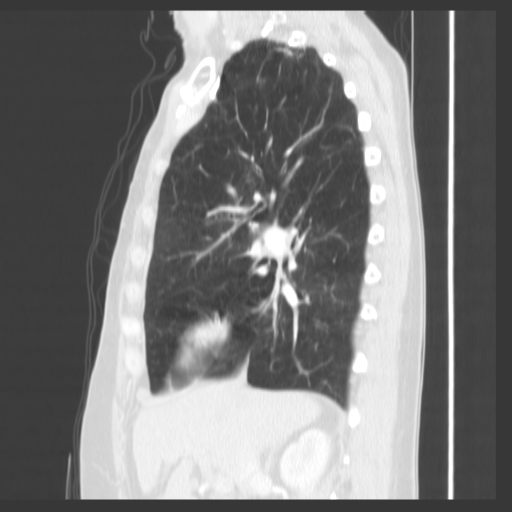
[im 59/99  lung]
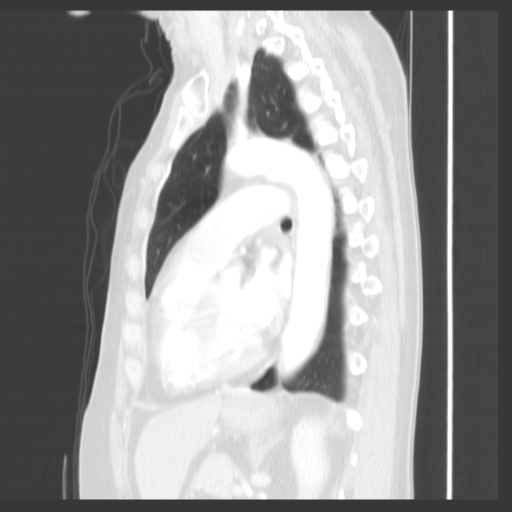
[im 69/99  lung]
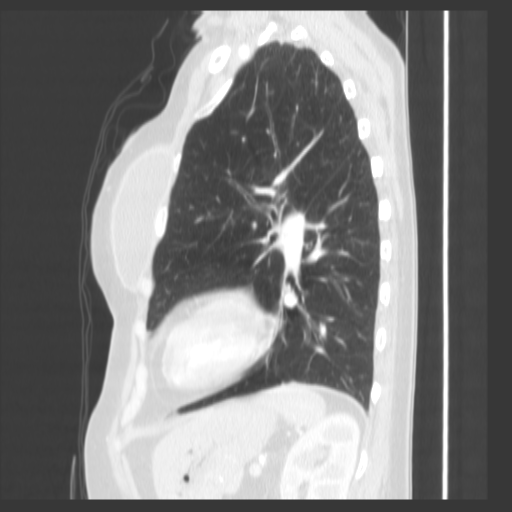
[im 79/99  lung]
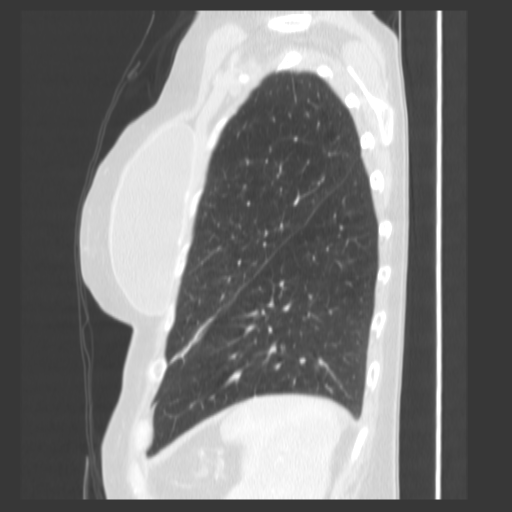
[im 89/99  lung]
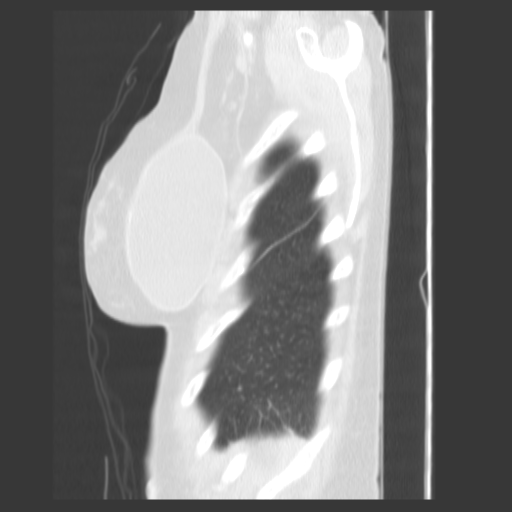

[Series 401: coronals · coronal · 0.67mm/px · 2 of 81 slices shown]
[im 11/81  lung]
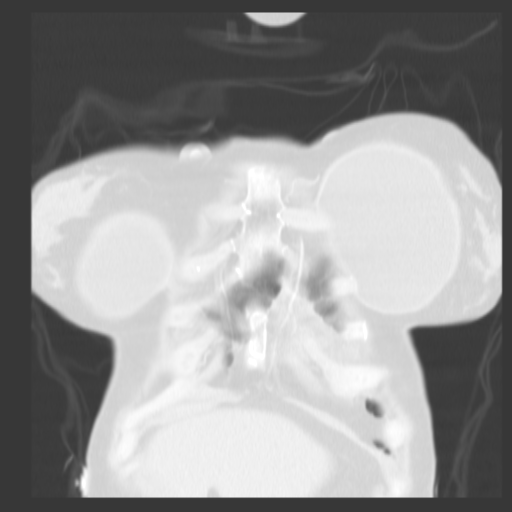
[im 21/81  lung]
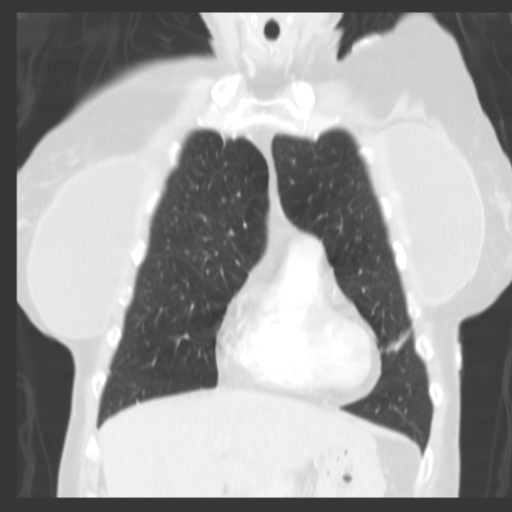

[17 of 29 positions shown; findings below may reference images not displayed]

FINDINGS: 8 x 20 mm nodule in the superior aspect of the right
middle lobe (series 3/image 30).  Adjacent 7 x 13 mm nodule (series
3/image 31).  Additional nodularity along the right major fissure
(series 3/image 23).  This appearance is unchanged from prior PET
CT, benign.

Stable subpleural ground-glass nodule in the lateral left lower
lobe (series 3/image 43).  Additional stable subpleural nodule in
the lateral left lung base (series 3/image 51.  Given long-term
stability, these are also benign.

Right apical pleural parenchymal scarring.  Underlying
centrilobular emphysematous changes.  No new/suspicious pulmonary
nodules.  No pleural effusion or pneumothorax.

Visualized thyroid is unremarkable.

The heart is normal in size.  No pericardial effusion.
Atherosclerotic calcifications of the aortic arch.

No suspicious mediastinal, hilar, or axillary lymphadenopathy.

Bilateral breast augmentation.

Visualized upper abdomen is notable for heterogeneous perfusion of
the spleen.

Visualized osseous structures are within normal limits.
IMPRESSION: Stable nodularity in the right middle lobe/along the right major
fissure.  Stable subpleural nodules in the left lower lobe.  These
findings are unchanged from 2777, benign.

No new/suspicious pulmonary nodules.

Underlying centrilobular emphysematous changes.

## 2012-07-25 ENCOUNTER — Encounter: Payer: Self-pay | Admitting: Internal Medicine

## 2012-07-25 ENCOUNTER — Ambulatory Visit (INDEPENDENT_AMBULATORY_CARE_PROVIDER_SITE_OTHER)
Admission: RE | Admit: 2012-07-25 | Discharge: 2012-07-25 | Disposition: A | Discharge status: Home / Self Care | Payer: 59 | Source: Ambulatory Visit | Attending: Internal Medicine | Admitting: Internal Medicine

## 2012-07-25 ENCOUNTER — Telehealth: Payer: Self-pay | Admitting: Internal Medicine

## 2012-07-25 ENCOUNTER — Ambulatory Visit (INDEPENDENT_AMBULATORY_CARE_PROVIDER_SITE_OTHER): Payer: 59 | Admitting: Internal Medicine

## 2012-07-25 VITALS — BP 100/60 | HR 108 | Temp 98.1°F | Resp 16 | Wt 125.0 lb

## 2012-07-25 DIAGNOSIS — J069 Acute upper respiratory infection, unspecified: Secondary | ICD-10-CM | POA: Insufficient documentation

## 2012-07-25 DIAGNOSIS — R0989 Other specified symptoms and signs involving the circulatory and respiratory systems: Secondary | ICD-10-CM

## 2012-07-25 DIAGNOSIS — R0600 Dyspnea, unspecified: Secondary | ICD-10-CM

## 2012-07-25 DIAGNOSIS — J441 Chronic obstructive pulmonary disease with (acute) exacerbation: Secondary | ICD-10-CM

## 2012-07-25 DIAGNOSIS — Z720 Tobacco use: Secondary | ICD-10-CM | POA: Insufficient documentation

## 2012-07-25 DIAGNOSIS — F17200 Nicotine dependence, unspecified, uncomplicated: Secondary | ICD-10-CM

## 2012-07-25 DIAGNOSIS — R0609 Other forms of dyspnea: Secondary | ICD-10-CM

## 2012-07-25 MED ORDER — METHYLPREDNISOLONE ACETATE 80 MG/ML IJ SUSP
120.0000 mg | Freq: Once | INTRAMUSCULAR | Status: AC
  Administered 2012-07-25: 120 mg via INTRAMUSCULAR

## 2012-07-25 MED ORDER — HYDROCODONE-HOMATROPINE 5-1.5 MG/5ML PO SYRP: 5.0000 mL | ORAL_SOLUTION | Freq: Four times a day (QID) | ORAL | Status: AC | PRN

## 2012-07-25 MED ORDER — MOXIFLOXACIN HCL 400 MG PO TABS: 400.0000 mg | ORAL_TABLET | Freq: Every day | ORAL | Status: AC

## 2012-07-25 MED ORDER — FLUTICASONE-SALMETEROL 100-50 MCG/DOSE IN AEPB: 1.0000 | INHALATION_SPRAY | Freq: Two times a day (BID) | RESPIRATORY_TRACT | Status: AC

## 2012-07-25 NOTE — Assessment & Plan Note (Signed)
Start Advair Depomedrol 120 mg IM If not better -- Prednisone 10 mg: take 4 tabs a day x 3 days; then 3 tabs a day x 4 days; then 2 tabs a day x 4 days, then 1 tab a day x 6 days, then stop. Take pc.

## 2012-07-25 NOTE — Assessment & Plan Note (Signed)
Stop smoking Steroids CXR See other meds

## 2012-07-25 NOTE — Assessment & Plan Note (Signed)
Avelox x 10 d CXR

## 2012-07-25 NOTE — Progress Notes (Signed)
  Subjective:    Patient ID: Connie Key, female    DOB: [birth year], 61 y.o.   MRN: [id number]  HPI  C/o cough, chills, eakness, aches, sinus d/c and SOB x 1 wk - worse now. No CP. No n/v...  Review of Systems  Constitutional: Positive for chills and fatigue. Negative for activity change, appetite change and unexpected weight change.  HENT: Positive for congestion and rhinorrhea. Negative for mouth sores and sinus pressure.   Eyes: Negative for visual disturbance.  Respiratory: Positive for cough, shortness of breath and wheezing. Negative for chest tightness.   Cardiovascular: Negative for leg swelling.  Gastrointestinal: Negative for nausea and abdominal pain.  Genitourinary: Negative for frequency, difficulty urinating and vaginal pain.  Musculoskeletal: Negative for back pain and gait problem.  Skin: Negative for pallor and rash.  Neurological: Negative for dizziness, tremors, weakness, numbness and headaches.  Psychiatric/Behavioral: Negative for confusion and disturbed wake/sleep cycle. The patient is not nervous/anxious.        Objective:   Physical Exam  Constitutional: She appears well-developed. No distress.       Looks tired NAD  HENT:  Head: Normocephalic.  Right Ear: External ear normal.  Left Ear: External ear normal.  Nose: Nose normal.  Mouth/Throat: Oropharynx is clear and moist.  Eyes: Conjunctivae normal are normal. Pupils are equal, round, and reactive to light. Right eye exhibits no discharge. Left eye exhibits no discharge.  Neck: Normal range of motion. Neck supple. No JVD present. No tracheal deviation present. No thyromegaly present.  Cardiovascular: Normal rate, regular rhythm and normal heart sounds.   Pulmonary/Chest: No stridor. No respiratory distress. She has wheezes. She has rales. She exhibits no tenderness.  Abdominal: Soft. Bowel sounds are normal. She exhibits no distension and no mass. There is no tenderness. There is no rebound and no  guarding.  Musculoskeletal: She exhibits no edema and no tenderness.  Lymphadenopathy:    She has no cervical adenopathy.  Neurological: She displays normal reflexes. No cranial nerve deficit. She exhibits normal muscle tone. Coordination normal.  Skin: No rash noted. No erythema.  Psychiatric: She has a normal mood and affect. Her behavior is normal. Judgment and thought content normal.    I personally provided the Advair inhaler use teaching. After the teaching patient was able to demonstrate it's use effectively. All questions were answered       Assessment & Plan:

## 2012-07-25 NOTE — Telephone Encounter (Signed)
Stacey, please, inform patient that there is no pneumonia on CXR Thx 

## 2012-07-25 NOTE — Patient Instructions (Addendum)
Use over-the-counter  "cold" medicines  such as "Tylenol cold" , "Advil cold",  "Mucinex" or" Mucinex D"  for cough and congestion.   Avoid decongestants if you have high blood pressure and use "Afrin" nasal spray for nasal congestion as directed instead. Use" Delsym" or" Robitussin" cough syrup varietis for cough.  You can use plain "Tylenol" or "Advil" for fever, chills and achyness.  Please, make an appointment if you are not better or if you're worse call 911.

## 2012-07-25 NOTE — Telephone Encounter (Signed)
Left detailed mess informing pt of below.  

## 2012-07-25 NOTE — Assessment & Plan Note (Signed)
Needs to stop 

## 2012-08-08 ENCOUNTER — Ambulatory Visit: Payer: 59 | Admitting: Internal Medicine

## 2012-08-18 ENCOUNTER — Telehealth: Payer: Self-pay | Admitting: Internal Medicine

## 2012-08-18 DIAGNOSIS — R569 Unspecified convulsions: Secondary | ICD-10-CM

## 2012-08-18 NOTE — Telephone Encounter (Signed)
Please advise in Dr. Plotnikovs absence 

## 2012-08-18 NOTE — Telephone Encounter (Signed)
Left message for pt to callback office.  

## 2012-08-18 NOTE — Telephone Encounter (Signed)
done

## 2012-08-18 NOTE — Telephone Encounter (Signed)
Caller: Kyleah/Patient; Phone: [phone]; Reason for Call: Patient calling to get referral from Dr Posey Rea for neurologist in [place] and states she already sees one in [place] but they are not doing anything for her.

## 2020-01-10 ENCOUNTER — Ambulatory Visit: Payer: Self-pay | Attending: Internal Medicine

## 2020-01-10 DIAGNOSIS — Z23 Encounter for immunization: Secondary | ICD-10-CM | POA: Insufficient documentation

## 2020-01-10 NOTE — Progress Notes (Signed)
   Covid-[facility] Vaccination [facility]  Name:  Connie Key    MRN: [id number] DOB: [birth year]  01/10/2020  [name] was observed post Covid-19 immunization for 15 minutes without incident. She was provided with Vaccine Information Sheet and instruction to access the V-Safe system.   [name] was instructed to call 911 with any severe reactions post vaccine: Marland Kitchen Difficulty breathing  . Swelling of face and throat  . A fast heartbeat  . A bad rash all over body  . Dizziness and weakness   Immunizations Administered    Name Date Dose VIS Date Route   Pfizer COVID-19 Vaccine 01/10/2020 12:56 PM 0.3 mL 10/16/2019 Intramuscular   Manufacturer: [employer], [employer]   Lot: [id number]   NDC: [address]-8

## 2020-02-09 ENCOUNTER — Ambulatory Visit: Payer: Self-pay | Attending: Internal Medicine

## 2020-02-09 DIAGNOSIS — Z23 Encounter for immunization: Secondary | ICD-10-CM

## 2020-02-09 NOTE — Progress Notes (Signed)
   Covid-19 Vaccination Clinic  Name:  JOYOUS GLEGHORN    MRN: [id number] DOB: 1951/05/02  02/09/2020  Ms. Hugill was observed post Covid-19 immunization for 15 minutes without incident. She was provided with Vaccine Information Sheet and instruction to access the V-Safe system.   Ms. Marasco was instructed to call 911 with any severe reactions post vaccine: Marland Kitchen Difficulty breathing  . Swelling of face and throat  . A fast heartbeat  . A bad rash all over body  . Dizziness and weakness   Immunizations Administered    Name Date Dose VIS Date Route   Pfizer COVID-19 Vaccine 02/09/2020 11:54 AM 0.3 mL 10/16/2019 Intramuscular   Manufacturer: [employer], [employer]   Lot: [id number]   NDC: [address]-7
